# Patient Record
Sex: Male | Born: 1935 | Race: White | Hispanic: No | Marital: Married | Smoking: Former smoker
Health system: Southern US, Community
[De-identification: ages and names within clinical notes are randomized; demographics above are authoritative.]

## PROBLEM LIST (undated history)

## (undated) DIAGNOSIS — N189 Chronic kidney disease, unspecified: Secondary | ICD-10-CM

## (undated) DIAGNOSIS — D126 Benign neoplasm of colon, unspecified: Secondary | ICD-10-CM

## (undated) DIAGNOSIS — H9193 Unspecified hearing loss, bilateral: Secondary | ICD-10-CM

## (undated) DIAGNOSIS — I351 Nonrheumatic aortic (valve) insufficiency: Secondary | ICD-10-CM

## (undated) DIAGNOSIS — H3530 Unspecified macular degeneration: Secondary | ICD-10-CM

## (undated) DIAGNOSIS — R7302 Impaired glucose tolerance (oral): Secondary | ICD-10-CM

## (undated) DIAGNOSIS — K644 Residual hemorrhoidal skin tags: Secondary | ICD-10-CM

## (undated) DIAGNOSIS — C61 Malignant neoplasm of prostate: Secondary | ICD-10-CM

## (undated) DIAGNOSIS — K5790 Diverticulosis of intestine, part unspecified, without perforation or abscess without bleeding: Secondary | ICD-10-CM

## (undated) DIAGNOSIS — M5416 Radiculopathy, lumbar region: Secondary | ICD-10-CM

## (undated) DIAGNOSIS — I071 Rheumatic tricuspid insufficiency: Secondary | ICD-10-CM

## (undated) DIAGNOSIS — I4891 Unspecified atrial fibrillation: Secondary | ICD-10-CM

## (undated) DIAGNOSIS — H409 Unspecified glaucoma: Secondary | ICD-10-CM

## (undated) DIAGNOSIS — I340 Nonrheumatic mitral (valve) insufficiency: Secondary | ICD-10-CM

## (undated) DIAGNOSIS — E785 Hyperlipidemia, unspecified: Secondary | ICD-10-CM

## (undated) DIAGNOSIS — I10 Essential (primary) hypertension: Secondary | ICD-10-CM

## (undated) HISTORY — DX: Malignant neoplasm of prostate: C61

## (undated) HISTORY — DX: Unspecified hearing loss, bilateral: H91.93

## (undated) HISTORY — DX: Impaired glucose tolerance (oral): R73.02

## (undated) HISTORY — DX: Hyperlipidemia, unspecified: E78.5

## (undated) HISTORY — DX: Unspecified atrial fibrillation: I48.91

## (undated) HISTORY — DX: Radiculopathy, lumbar region: M54.16

## (undated) HISTORY — DX: Benign neoplasm of colon, unspecified: D12.6

## (undated) HISTORY — DX: Residual hemorrhoidal skin tags: K64.4

## (undated) HISTORY — DX: Rheumatic tricuspid insufficiency: I07.1

## (undated) HISTORY — DX: Essential (primary) hypertension: I10

## (undated) HISTORY — DX: Diverticulosis of intestine, part unspecified, without perforation or abscess without bleeding: K57.90

## (undated) HISTORY — DX: Chronic kidney disease, unspecified: N18.9

## (undated) HISTORY — DX: Nonrheumatic mitral (valve) insufficiency: I34.0

## (undated) HISTORY — DX: Unspecified glaucoma: H40.9

## (undated) HISTORY — DX: Nonrheumatic aortic (valve) insufficiency: I35.1

## (undated) HISTORY — PX: APPENDECTOMY: SHX54

## (undated) HISTORY — DX: Unspecified macular degeneration: H35.30

---

## 1993-01-20 HISTORY — PX: ELBOW FRACTURE SURGERY: SHX616

## 2004-01-21 HISTORY — PX: COLONOSCOPY: SHX174

## 2004-11-18 ENCOUNTER — Ambulatory Visit: Payer: Self-pay | Admitting: Internal Medicine

## 2004-11-20 DIAGNOSIS — D126 Benign neoplasm of colon, unspecified: Secondary | ICD-10-CM

## 2004-11-20 HISTORY — DX: Benign neoplasm of colon, unspecified: D12.6

## 2004-12-03 ENCOUNTER — Ambulatory Visit: Payer: Self-pay | Admitting: Internal Medicine

## 2004-12-03 ENCOUNTER — Encounter (INDEPENDENT_AMBULATORY_CARE_PROVIDER_SITE_OTHER): Payer: Self-pay | Admitting: Specialist

## 2004-12-03 DIAGNOSIS — Z860100 Personal history of colon polyps, unspecified: Secondary | ICD-10-CM | POA: Insufficient documentation

## 2005-02-04 ENCOUNTER — Ambulatory Visit: Admission: RE | Admit: 2005-02-04 | Discharge: 2005-06-18 | Payer: Self-pay | Admitting: Radiation Oncology

## 2005-04-23 ENCOUNTER — Encounter: Admission: RE | Admit: 2005-04-23 | Discharge: 2005-04-23 | Payer: Self-pay | Admitting: Urology

## 2005-04-30 ENCOUNTER — Ambulatory Visit (HOSPITAL_BASED_OUTPATIENT_CLINIC_OR_DEPARTMENT_OTHER): Admission: RE | Admit: 2005-04-30 | Discharge: 2005-04-30 | Payer: Self-pay | Admitting: Urology

## 2006-01-20 HISTORY — PX: CATARACT EXTRACTION: SUR2

## 2007-12-03 ENCOUNTER — Ambulatory Visit: Payer: Self-pay | Admitting: Cardiology

## 2007-12-03 LAB — CONVERTED CEMR LAB
Prothrombin Time: 15.7 s — ABNORMAL HIGH (ref 10.9–13.3)
TSH: 1.14 microintl units/mL (ref 0.35–5.50)

## 2007-12-09 ENCOUNTER — Ambulatory Visit: Payer: Self-pay | Admitting: Cardiology

## 2007-12-09 ENCOUNTER — Ambulatory Visit: Payer: Self-pay | Admitting: Internal Medicine

## 2007-12-09 LAB — CONVERTED CEMR LAB
Calcium: 10 mg/dL (ref 8.4–10.5)
GFR calc Af Amer: 83 mL/min
GFR calc non Af Amer: 69 mL/min
Potassium: 3.9 meq/L (ref 3.5–5.1)
Sodium: 143 meq/L (ref 135–145)

## 2007-12-15 ENCOUNTER — Ambulatory Visit: Payer: Self-pay | Admitting: Cardiology

## 2007-12-22 ENCOUNTER — Ambulatory Visit: Payer: Self-pay | Admitting: Internal Medicine

## 2007-12-23 ENCOUNTER — Encounter: Payer: Self-pay | Admitting: Cardiology

## 2007-12-23 ENCOUNTER — Ambulatory Visit: Payer: Self-pay

## 2007-12-30 ENCOUNTER — Ambulatory Visit: Payer: Self-pay | Admitting: Cardiology

## 2008-01-06 ENCOUNTER — Ambulatory Visit: Payer: Self-pay | Admitting: Cardiology

## 2008-01-21 DIAGNOSIS — H409 Unspecified glaucoma: Secondary | ICD-10-CM

## 2008-01-21 HISTORY — DX: Unspecified glaucoma: H40.9

## 2008-01-26 ENCOUNTER — Ambulatory Visit: Payer: Self-pay | Admitting: Cardiology

## 2008-02-02 ENCOUNTER — Ambulatory Visit: Payer: Self-pay | Admitting: Cardiology

## 2008-02-10 ENCOUNTER — Ambulatory Visit: Payer: Self-pay | Admitting: Cardiology

## 2008-02-15 ENCOUNTER — Ambulatory Visit: Payer: Self-pay | Admitting: Cardiology

## 2008-02-15 ENCOUNTER — Ambulatory Visit (HOSPITAL_COMMUNITY): Admission: RE | Admit: 2008-02-15 | Discharge: 2008-02-15 | Payer: Self-pay | Admitting: Cardiology

## 2008-02-18 ENCOUNTER — Ambulatory Visit: Payer: Self-pay | Admitting: Cardiovascular Disease

## 2008-03-01 ENCOUNTER — Ambulatory Visit: Payer: Self-pay | Admitting: Cardiology

## 2008-03-03 ENCOUNTER — Ambulatory Visit: Payer: Self-pay | Admitting: Cardiology

## 2008-03-17 ENCOUNTER — Ambulatory Visit: Payer: Self-pay | Admitting: Internal Medicine

## 2008-04-14 ENCOUNTER — Ambulatory Visit: Payer: Self-pay | Admitting: Cardiology

## 2008-05-12 ENCOUNTER — Ambulatory Visit: Payer: Self-pay | Admitting: Cardiology

## 2008-06-09 ENCOUNTER — Ambulatory Visit: Payer: Self-pay | Admitting: Internal Medicine

## 2008-06-20 ENCOUNTER — Encounter: Payer: Self-pay | Admitting: *Deleted

## 2008-07-05 ENCOUNTER — Encounter (INDEPENDENT_AMBULATORY_CARE_PROVIDER_SITE_OTHER): Payer: Self-pay | Admitting: *Deleted

## 2008-07-07 ENCOUNTER — Encounter (INDEPENDENT_AMBULATORY_CARE_PROVIDER_SITE_OTHER): Payer: Self-pay | Admitting: Cardiology

## 2008-07-07 ENCOUNTER — Ambulatory Visit: Payer: Self-pay | Admitting: Cardiology

## 2008-07-07 DIAGNOSIS — I4891 Unspecified atrial fibrillation: Secondary | ICD-10-CM | POA: Insufficient documentation

## 2008-07-07 LAB — CONVERTED CEMR LAB: Protime: 18.2

## 2008-07-26 ENCOUNTER — Encounter: Admission: RE | Admit: 2008-07-26 | Discharge: 2008-07-26 | Payer: Self-pay | Admitting: Emergency Medicine

## 2008-07-26 ENCOUNTER — Encounter: Payer: Self-pay | Admitting: *Deleted

## 2008-07-28 ENCOUNTER — Encounter: Admission: RE | Admit: 2008-07-28 | Discharge: 2008-07-28 | Payer: Self-pay | Admitting: Emergency Medicine

## 2008-08-04 ENCOUNTER — Ambulatory Visit: Payer: Self-pay | Admitting: Cardiology

## 2008-08-04 LAB — CONVERTED CEMR LAB
POC INR: 2.7
Prothrombin Time: 19.9 s

## 2008-08-21 DIAGNOSIS — I079 Rheumatic tricuspid valve disease, unspecified: Secondary | ICD-10-CM | POA: Insufficient documentation

## 2008-08-21 DIAGNOSIS — E785 Hyperlipidemia, unspecified: Secondary | ICD-10-CM | POA: Insufficient documentation

## 2008-08-21 DIAGNOSIS — C61 Malignant neoplasm of prostate: Secondary | ICD-10-CM | POA: Insufficient documentation

## 2008-08-21 DIAGNOSIS — I080 Rheumatic disorders of both mitral and aortic valves: Secondary | ICD-10-CM | POA: Insufficient documentation

## 2008-08-21 DIAGNOSIS — I10 Essential (primary) hypertension: Secondary | ICD-10-CM | POA: Insufficient documentation

## 2008-08-21 DIAGNOSIS — I359 Nonrheumatic aortic valve disorder, unspecified: Secondary | ICD-10-CM | POA: Insufficient documentation

## 2008-08-22 ENCOUNTER — Ambulatory Visit: Payer: Self-pay | Admitting: Cardiology

## 2008-09-01 ENCOUNTER — Ambulatory Visit: Payer: Self-pay | Admitting: Cardiology

## 2008-09-29 ENCOUNTER — Ambulatory Visit: Payer: Self-pay | Admitting: Internal Medicine

## 2008-10-27 ENCOUNTER — Ambulatory Visit: Payer: Self-pay | Admitting: Internal Medicine

## 2008-11-24 ENCOUNTER — Ambulatory Visit: Payer: Self-pay | Admitting: Cardiology

## 2008-12-22 ENCOUNTER — Ambulatory Visit: Payer: Self-pay | Admitting: Cardiology

## 2009-01-26 ENCOUNTER — Ambulatory Visit: Payer: Self-pay | Admitting: Cardiovascular Disease

## 2009-02-23 ENCOUNTER — Ambulatory Visit: Payer: Self-pay | Admitting: Cardiovascular Disease

## 2009-02-23 ENCOUNTER — Encounter (INDEPENDENT_AMBULATORY_CARE_PROVIDER_SITE_OTHER): Payer: Self-pay | Admitting: Cardiology

## 2009-03-23 ENCOUNTER — Ambulatory Visit: Payer: Self-pay | Admitting: Cardiovascular Disease

## 2009-03-23 LAB — CONVERTED CEMR LAB: POC INR: 2.5

## 2009-04-20 ENCOUNTER — Ambulatory Visit: Payer: Self-pay | Admitting: Cardiovascular Disease

## 2009-04-20 LAB — CONVERTED CEMR LAB: POC INR: 2.7

## 2009-05-18 ENCOUNTER — Ambulatory Visit: Payer: Self-pay | Admitting: Internal Medicine

## 2009-05-24 ENCOUNTER — Ambulatory Visit: Payer: Self-pay | Admitting: Cardiology

## 2009-06-01 ENCOUNTER — Ambulatory Visit: Payer: Self-pay | Admitting: Cardiology

## 2009-06-01 ENCOUNTER — Encounter: Payer: Self-pay | Admitting: Cardiology

## 2009-06-01 ENCOUNTER — Ambulatory Visit (HOSPITAL_COMMUNITY): Admission: RE | Admit: 2009-06-01 | Discharge: 2009-06-01 | Payer: Self-pay | Admitting: Cardiology

## 2009-06-01 ENCOUNTER — Ambulatory Visit: Payer: Self-pay | Admitting: Cardiovascular Disease

## 2009-06-01 ENCOUNTER — Ambulatory Visit: Payer: Self-pay

## 2009-06-04 LAB — CONVERTED CEMR LAB
CO2: 33 meq/L — ABNORMAL HIGH (ref 19–32)
Calcium: 10 mg/dL (ref 8.4–10.5)
Chloride: 102 meq/L (ref 96–112)
Creatinine, Ser: 1.3 mg/dL (ref 0.4–1.5)
Glucose, Bld: 89 mg/dL (ref 70–99)
Sodium: 143 meq/L (ref 135–145)

## 2009-06-15 ENCOUNTER — Ambulatory Visit: Payer: Self-pay | Admitting: Cardiovascular Disease

## 2009-06-15 LAB — CONVERTED CEMR LAB: POC INR: 2.2

## 2009-07-13 ENCOUNTER — Ambulatory Visit: Payer: Self-pay | Admitting: Cardiology

## 2009-07-13 LAB — CONVERTED CEMR LAB: POC INR: 2.5

## 2009-07-27 ENCOUNTER — Encounter: Admission: RE | Admit: 2009-07-27 | Discharge: 2009-07-27 | Payer: Self-pay | Admitting: Emergency Medicine

## 2009-08-10 ENCOUNTER — Ambulatory Visit: Payer: Self-pay | Admitting: Cardiology

## 2009-08-10 LAB — CONVERTED CEMR LAB: POC INR: 2.8

## 2009-09-07 ENCOUNTER — Ambulatory Visit: Payer: Self-pay | Admitting: Cardiology

## 2009-09-07 LAB — CONVERTED CEMR LAB: POC INR: 2.9

## 2009-10-05 ENCOUNTER — Ambulatory Visit: Payer: Self-pay | Admitting: Internal Medicine

## 2009-10-05 LAB — CONVERTED CEMR LAB: POC INR: 2.5

## 2009-11-02 ENCOUNTER — Ambulatory Visit: Payer: Self-pay | Admitting: Cardiology

## 2009-11-02 LAB — CONVERTED CEMR LAB: POC INR: 3

## 2009-11-30 ENCOUNTER — Ambulatory Visit: Payer: Self-pay | Admitting: Cardiology

## 2009-11-30 LAB — CONVERTED CEMR LAB: POC INR: 2.8

## 2009-12-18 ENCOUNTER — Encounter (INDEPENDENT_AMBULATORY_CARE_PROVIDER_SITE_OTHER): Payer: Self-pay | Admitting: *Deleted

## 2009-12-28 ENCOUNTER — Ambulatory Visit: Payer: Self-pay | Admitting: Internal Medicine

## 2009-12-28 LAB — CONVERTED CEMR LAB: POC INR: 2.4

## 2010-01-20 DIAGNOSIS — H9193 Unspecified hearing loss, bilateral: Secondary | ICD-10-CM

## 2010-01-20 DIAGNOSIS — H3530 Unspecified macular degeneration: Secondary | ICD-10-CM

## 2010-01-20 HISTORY — DX: Unspecified hearing loss, bilateral: H91.93

## 2010-01-20 HISTORY — DX: Unspecified macular degeneration: H35.30

## 2010-01-25 ENCOUNTER — Ambulatory Visit: Admission: RE | Admit: 2010-01-25 | Discharge: 2010-01-25 | Payer: Self-pay | Source: Home / Self Care

## 2010-01-25 LAB — CONVERTED CEMR LAB: POC INR: 2.4

## 2010-02-19 NOTE — Medication Information (Signed)
Summary: rov/ewj  Anticoagulant Therapy  Managed by: Freddrick March, RN, BSN Referring MD: Kirk Ruths MD Supervising MD: Johnsie Cancel MD, Collier Salina Indication 1: Atrial Fibrillation (ICD-427.31) Lab Used: [place] Site: [facility] INR POC 2.7 INR RANGE 2 - 3  Dietary changes: no    Health status changes: no    Bleeding/hemorrhagic complications: no    Recent/future hospitalizations: no    Any changes in medication regimen? no    Recent/future dental: no  Any missed doses?: no       Is patient compliant with meds? yes       Allergies (verified): No Known Drug Allergies  Anticoagulation Management History:      The patient is taking warfarin and comes in today for a routine follow up visit.  Positive risk factors for bleeding include an age of 75 years or older.  The bleeding index is 'intermediate risk'.  Positive CHADS2 values include History of HTN and Age > 75 years old.  The start date was 11/19/2007.  His last INR was 1.4 RATIO.  Anticoagulation responsible provider: Johnsie Cancel MD, Collier Salina.  INR POC: 2.7.  Cuvette Lot#: [id number].  Exp: 05/2010.    Anticoagulation Management Assessment/Plan:      The patient's current anticoagulation dose is Warfarin sodium 4 mg tabs: Use as directed by Anticoagulation Clinic.  The target INR is 2 - 3.  The next INR is due 05/18/2009.  Anticoagulation instructions were given to patient.  Results were reviewed/authorized by Freddrick March, RN, BSN.  He was notified by Freddrick March RN.         Prior Anticoagulation Instructions: INR 2.5  Continue on same dosage 2 tablets daily except 1.5 tablets on Tuesdays and Saturdays.  Recheck in 4 weeks.    Current Anticoagulation Instructions: INR 2.7  Continue on same dosage 2 tablets daily except 1.5 tablets on Tuesdays and Saturdays.  Recheck in 4 weeks.

## 2010-02-19 NOTE — Medication Information (Signed)
Summary: rov/sl   Anticoagulant Therapy  Managed by: Porfirio Oar, PharmD Referring MD: Kirk Ruths MD Supervising MD: Harrington Challenger MD, Nevin Bloodgood Indication 1: Atrial Fibrillation (ICD-427.31) Lab Used: [facility] Site: [facility] INR POC 2.4 INR RANGE 2 - 3  Dietary changes: no    Health status changes: no    Bleeding/hemorrhagic complications: no    Recent/future hospitalizations: no    Any changes in medication regimen? no    Recent/future dental: no  Any missed doses?: no       Is patient compliant with meds? yes       Allergies: No Known Drug Allergies  Anticoagulation Management History:      The patient is taking warfarin and comes in today for a routine follow up visit.  Positive risk factors for bleeding include an age of 75 years or older.  The bleeding index is 'intermediate risk'.  Positive CHADS2 values include History of HTN and Age > 30 years old.  The start date was 11/19/2007.  His last INR was 1.4 RATIO.  Anticoagulation responsible Leianna Barga: Harrington Challenger MD, Nevin Bloodgood.  INR POC: 2.4.  Cuvette Lot#: [id number].  Exp: 10/2010.    Anticoagulation Management Assessment/Plan:      The patient's current anticoagulation dose is Warfarin sodium 4 mg tabs: Use as directed by Anticoagulation Clinic.  The target INR is 2 - 3.  The next INR is due 01/25/2010.  Anticoagulation instructions were given to patient.  Results were reviewed/authorized by Porfirio Oar, PharmD.  He was notified by Porfirio Oar PharmD.         Prior Anticoagulation Instructions: INR 2.8  Continue taking Coumadin 2 tabs (8 mg) on all days except Coumadin 1.5 tabs (6 mg) on Tuesdays and Saturdays. Return to clinic in 4 weeks.   Current Anticoagulation Instructions: INR 2.4  Continue same dose of 2 tablets every day except 1 1/2 tablets on Tuesday and Saturday.  Recheck INR in 4 weeks.

## 2010-02-19 NOTE — Medication Information (Signed)
Summary: rov/sl   Anticoagulant Therapy  Managed by: Gypsy Lore, PharmD Referring MD: Kirk Ruths MD Supervising MD: Ron Parker MD, Dellis Filbert Indication 1: Atrial Fibrillation (ICD-427.31) Lab Used: [place] Site: [facility] INR POC 2.8 INR RANGE 2 - 3  Dietary changes: no    Health status changes: no    Bleeding/hemorrhagic complications: no    Recent/future hospitalizations: no    Any changes in medication regimen? no    Recent/future dental: no  Any missed doses?: no       Is patient compliant with meds? yes       Allergies: No Known Drug Allergies  Anticoagulation Management History:      The patient is taking warfarin and comes in today for a routine follow up visit.  Positive risk factors for bleeding include an age of 75 years or older.  The bleeding index is 'intermediate risk'.  Positive CHADS2 values include History of HTN and Age > 75 years old.  The start date was 11/19/2007.  His last INR was 1.4 RATIO.  Anticoagulation responsible Jenin Birdsall: Ron Parker MD, Dellis Filbert.  INR POC: 2.8.  Cuvette Lot#: [id number].  Exp: 10/2010.    Anticoagulation Management Assessment/Plan:      The patient's current anticoagulation dose is Warfarin sodium 4 mg tabs: Use as directed by Anticoagulation Clinic.  The target INR is 2 - 3.  The next INR is due 12/28/2009.  Anticoagulation instructions were given to patient.  Results were reviewed/authorized by Gypsy Lore, PharmD.  He was notified by Gypsy Lore PharmD.         Prior Anticoagulation Instructions: INR 3.0  Continue taking Coumadin 8 mg (2 tabs) on Sun, Mon, Wed, Thur, Fri and Coumadin 6 mg (1.5 tabs) on Tues and Sat. Return to clinc in 4 weeks.   Current Anticoagulation Instructions: INR 2.8  Continue taking Coumadin 2 tabs (8 mg) on all days except Coumadin 1.5 tabs (6 mg) on Tuesdays and Saturdays. Return to clinic in 4 weeks.

## 2010-02-19 NOTE — Medication Information (Signed)
Summary: rov/cb  Anticoagulant Therapy  Managed by: Tula Nakayama, RN, BSN Referring MD: Kirk Ruths MD Supervising MD: Stanford Breed MD, Aaron Edelman Indication 1: Atrial Fibrillation (ICD-427.31) Lab Used: [facility] Site: [facility] INR POC 2.8 INR RANGE 2 - 3  Dietary changes: no    Health status changes: no    Bleeding/hemorrhagic complications: no    Recent/future hospitalizations: no    Any changes in medication regimen? no    Recent/future dental: no  Any missed doses?: no       Is patient compliant with meds? yes       Allergies: No Known Drug Allergies  Anticoagulation Management History:      The patient is taking warfarin and comes in today for a routine follow up visit.  Positive risk factors for bleeding include an age of 75 years or older.  The bleeding index is 'intermediate risk'.  Positive CHADS2 values include History of HTN and Age > 44 years old.  The start date was 11/19/2007.  His last INR was 1.4 RATIO.  Anticoagulation responsible provider: Stanford Breed MD, Aaron Edelman.  INR POC: 2.8.  Cuvette Lot#: [id number].  Exp: 10/2010.    Anticoagulation Management Assessment/Plan:      The patient's current anticoagulation dose is Warfarin sodium 4 mg tabs: Use as directed by Anticoagulation Clinic.  The target INR is 2 - 3.  The next INR is due 09/07/2009.  Anticoagulation instructions were given to patient.  Results were reviewed/authorized by Tula Nakayama, RN, BSN.  He was notified by Tula Nakayama, RN, BSN.         Prior Anticoagulation Instructions: INR 2.5. Take 2 tablets daily except 1.5 tablets on Tues and Sat. Recheck in 4 weeks.  Current Anticoagulation Instructions: INR 2.8 Continue 8mg s everyday except 6mg s on Tuesdays and Saturdays. REcheck in 4 weeks.

## 2010-02-19 NOTE — Medication Information (Signed)
Summary: rov/ewj  Anticoagulant Therapy  Managed by: Belenda Cruise, PharmD Candidate Referring MD: Kirk Ruths MD Supervising MD: Johnsie Cancel MD, Collier Salina Indication 1: Atrial Fibrillation (ICD-427.31) Lab Used: [facility] Site: [facility] INR POC 2.2 INR RANGE 2 - 3  Dietary changes: no    Health status changes: no    Bleeding/hemorrhagic complications: no    Recent/future hospitalizations: no    Any changes in medication regimen? yes       Details: Started cefdinir 300mg  for bronchitis on 1/3.  Recent/future dental: no  Any missed doses?: no       Is patient compliant with meds? yes       Allergies (verified): No Known Drug Allergies  Anticoagulation Management History:      The patient is taking warfarin and comes in today for a routine follow up visit.  Positive risk factors for bleeding include an age of 75 years or older.  The bleeding index is 'intermediate risk'.  Positive CHADS2 values include History of HTN and Age > 37 years old.  The start date was 11/19/2007.  His last INR was 1.4 RATIO.  Anticoagulation responsible provider: Johnsie Cancel MD, Collier Salina.  INR POC: 2.2.  Cuvette Lot#: [id number].  Exp: 02/2010.    Anticoagulation Management Assessment/Plan:      The patient's current anticoagulation dose is Warfarin sodium 4 mg tabs: Use as directed by Anticoagulation Clinic.  The target INR is 2 - 3.  The next INR is due 02/23/2009.  Anticoagulation instructions were given to patient.  Results were reviewed/authorized by Belenda Cruise, PharmD Candidate.  He was notified by Belenda Cruise, PharmD Candidate.         Prior Anticoagulation Instructions: INR 2.6  Continue on same dosage 2 tablets daily except 1.5 tablets on Tuesdays and Saturdays.  Recheck in 4 weeks.    Current Anticoagulation Instructions: INR 2.2  Continue on same dose of 2 tablets daily except 1.5 tablets on Tuesdays and Saturdays. Recheck in 4 weeks.

## 2010-02-19 NOTE — Medication Information (Signed)
Summary: rov.mp  Anticoagulant Therapy  Managed by: Freddrick March, RN, BSN Referring MD: Kirk Ruths MD Supervising MD: Johnsie Cancel MD, Collier Salina Indication 1: Atrial Fibrillation (ICD-427.31) Lab Used: [place] Site: [facility] INR POC 2.5 INR RANGE 2 - 3  Dietary changes: no    Health status changes: no    Bleeding/hemorrhagic complications: no    Recent/future hospitalizations: no    Any changes in medication regimen? no    Recent/future dental: no  Any missed doses?: no       Is patient compliant with meds? yes       Allergies (verified): No Known Drug Allergies  Anticoagulation Management History:      The patient is taking warfarin and comes in today for a routine follow up visit.  Positive risk factors for bleeding include an age of 6 years or older.  The bleeding index is 'intermediate risk'.  Positive CHADS2 values include History of HTN and Age > 80 years old.  The start date was 11/19/2007.  His last INR was 1.4 RATIO.  Anticoagulation responsible provider: Johnsie Cancel MD, Collier Salina.  INR POC: 2.5.  Cuvette Lot#: [id number].  Exp: 05/2010.    Anticoagulation Management Assessment/Plan:      The patient's current anticoagulation dose is Warfarin sodium 4 mg tabs: Use as directed by Anticoagulation Clinic.  The target INR is 2 - 3.  The next INR is due 04/20/2009.  Anticoagulation instructions were given to patient.  Results were reviewed/authorized by Freddrick March, RN, BSN.  He was notified by Freddrick March RN.         Prior Anticoagulation Instructions: INR 2.2  Continue 2 tabs daily except 1.5 tabs each Tuesday and Saturday.  Recheck in 4 weeks.    Current Anticoagulation Instructions: INR 2.5  Continue on same dosage 2 tablets daily except 1.5 tablets on Tuesdays and Saturdays.  Recheck in 4 weeks.

## 2010-02-19 NOTE — Assessment & Plan Note (Signed)
Summary: F9M/DM  Medications Added QUINAPRIL HCL 20 MG TABS (QUINAPRIL HCL) one and one half tablet by mouth once daily        CC:  no complaints.  History of Present Illness: [name] is a pleasant gentleman who has a history of hypertension, hyperlipidemia, and atrial fibrillation.  Note, he did have a Myoview performed on December 23, 2007  that showed an ejection fraction 64% with no ischemia or infarction.  An echocardiogram was also performed which showed normal LV function, mild aortic insufficiency, mild-to-moderate mitral regurgitation, and mild-to-moderate tricuspid regurgitation. There was also biatrial enlargement.  A TSH was normal.  He has had a previous attempt at outpatient cardioversion.  He transiently converted to sinus rhythm, but maintained sinus rhythm only for seconds. We have therefore elected to treat him with rate control and anticoagulation. A previous Holter monitor in February of 2010 showed that his rate was adequately controlled. I last saw him in August of 2010. Since then he is doing well with no dyspnea on exertion, orthopnea, PND, pedal edema, palpitations, syncope or chest pain.  Current Medications (verified): 1)  Aspirin 81 Mg Tbec (Aspirin) .Ronnie Sullivan.. 1 Daily 2)  Doxazosin Mesylate 8 Mg Tabs (Doxazosin Mesylate) .Ronnie Sullivan.. 1 By Mouth Daily 3)  Hydrochlorothiazide 50 Mg Tabs (Hydrochlorothiazide) .... Take One Tablet By Mouth Daily. 4)  Lipitor 10 Mg Tabs (Atorvastatin Calcium) .Ronnie Sullivan.. 1 By Mouth Daily 5)  Quinapril Hcl 20 Mg Tabs (Quinapril Hcl) .Ronnie Sullivan.. 1 By Mouth Daily 6)  Metoprolol Succinate 100 Mg Xr24h-Tab (Metoprolol Succinate) .... Take One and One-Half Tablets By Mouth Daily 7)  Digoxin 0.125 Mg Tabs (Digoxin) .... Take 1 Tab By Mouth Daily 8)  Warfarin Sodium 4 Mg Tabs (Warfarin Sodium) .... Use As Directed By Anticoagulation Clinic 9)  Xalatan 0.005 % Soln (Latanoprost) .Ronnie Sullivan.. 1 Drop in Each Eye Daily 10)  Multivitamins  Tabs (Multiple Vitamin) .Ronnie Sullivan.. 1 By Mouth  Daily 11)  Fish Oil Maximum Strength 1200 Mg Caps (Omega-3 Fatty Acids) .Ronnie Sullivan.. 1 By Mouth Tid 12)  Vitamin C 500 Mg Tabs (Ascorbic Acid) .Ronnie Sullivan.. 1 Tab By Mouth Once Daily  Allergies: No Known Drug Allergies  Past History:  Past Medical History: Reviewed history from 08/21/2008 and no changes required. Current Problems:  ATRIAL FIBRILLATION (ICD-427.31) TRICUSPID REGURGITATION (ICD-397.0) MITRAL REGURGITATION (ICD-396.3) AORTIC INSUFFICIENCY (ICD-424.1) HYPERLIPIDEMIA (ICD-272.4) HYPERTENSION (ICD-401.9) PROSTATE CANCER (ICD-185) COUMADIN THERAPY (ICD-V58.61)  Past Surgical History: Reviewed history from 08/21/2008 and no changes required. Appendectomy elbow  Social History: Reviewed history from 08/21/2008 and no changes required. Retired  Married  Tobacco Use - No.  Drug Use - no  Review of Systems       Some arthralgias but no fevers or chills, productive cough, hemoptysis, dysphasia, odynophagia, melena, hematochezia, dysuria, hematuria, rash, seizure activity, orthopnea, PND, pedal edema, claudication. Remaining systems are negative.   Vital Signs:  Patient profile:   77 year old male Height:      69 inches Weight:      228 pounds BMI:     33.79 Pulse rate:   98 / minute Resp:     12 per minute BP sitting:   152 / 84  (right arm)  Vitals Entered By: Burnett Kanaris (May 24, 2009 8:44 AM)  Physical Exam  General:  Well-developed well-nourished in no acute distress.  Skin is warm and dry.  HEENT is normal.  Neck is supple. No thyromegaly.  Chest is clear to auscultation with normal expansion.  Cardiovascular exam is irregular  Abdominal exam nontender or distended. No masses palpated. Extremities show no edema. neuro grossly intact    EKG  Procedure date:  05/24/2009  Findings:      atrial fibrillation at a rate of 77. Axis normal. Nonspecific ST changes.  Impression & Recommendations:  Problem # 1:  ATRIAL FIBRILLATION (ICD-427.31) Rate  controlled. Continue beta blocker, digoxin and Coumadin. His updated medication list for this problem includes:    Aspirin 81 Mg Tbec (Aspirin) .Ronnie Sullivan... 1 daily    Metoprolol Succinate 100 Mg Xr24h-tab (Metoprolol succinate) .Ronnie Sullivan... Take one and one-half tablets by mouth daily    Digoxin 0.125 Mg Tabs (Digoxin) .Ronnie Sullivan... Take 1 tab by mouth daily    Warfarin Sodium 4 Mg Tabs (Warfarin sodium) ..... Use as directed by anticoagulation clinic  His updated medication list for this problem includes:    Aspirin 81 Mg Tbec (Aspirin) .Ronnie Sullivan... 1 daily    Metoprolol Succinate 100 Mg Xr24h-tab (Metoprolol succinate) .Ronnie Sullivan... Take one and one-half tablets by mouth daily    Digoxin 0.125 Mg Tabs (Digoxin) .Ronnie Sullivan... Take 1 tab by mouth daily    Warfarin Sodium 4 Mg Tabs (Warfarin sodium) ..... Use as directed by anticoagulation clinic  Problem # 2:  MITRAL REGURGITATION (ICD-396.3) Plan repeat echocardiogram.  Problem # 3:  HYPERLIPIDEMIA (ICD-272.4)  Continue statin. Lipids and liver monitored by primary care. His updated medication list for this problem includes:    Lipitor 10 Mg Tabs (Atorvastatin calcium) .Ronnie Sullivan... 1 by mouth daily  His updated medication list for this problem includes:    Lipitor 10 Mg Tabs (Atorvastatin calcium) .Ronnie Sullivan... 1 by mouth daily  Problem # 4:  HYPERTENSION (ICD-401.9)  Blood pressure elevated. Increase quinapril to 30 mg p.o. daily. Bmet in one week. His updated medication list for this problem includes:    Aspirin 81 Mg Tbec (Aspirin) .Ronnie Sullivan... 1 daily    Doxazosin Mesylate 8 Mg Tabs (Doxazosin mesylate) .Ronnie Sullivan... 1 by mouth daily    Hydrochlorothiazide 50 Mg Tabs (Hydrochlorothiazide) .Ronnie Sullivan... Take one tablet by mouth daily.    Quinapril Hcl 20 Mg Tabs (Quinapril hcl) ..... One and one half tablet by mouth once daily    Metoprolol Succinate 100 Mg Xr24h-tab (Metoprolol succinate) .Ronnie Sullivan... Take one and one-half tablets by mouth daily  His updated medication list for this problem includes:    Aspirin  81 Mg Tbec (Aspirin) .Ronnie Sullivan... 1 daily    Doxazosin Mesylate 8 Mg Tabs (Doxazosin mesylate) .Ronnie Sullivan... 1 by mouth daily    Hydrochlorothiazide 50 Mg Tabs (Hydrochlorothiazide) .Ronnie Sullivan... Take one tablet by mouth daily.    Quinapril Hcl 20 Mg Tabs (Quinapril hcl) ..... One and one half tablet by mouth once daily    Metoprolol Succinate 100 Mg Xr24h-tab (Metoprolol succinate) .Ronnie Sullivan... Take one and one-half tablets by mouth daily  Problem # 5:  COUMADIN THERAPY (ICD-V58.61) Monitored in the Coumadin clinic. Hemoglobin monitored by his primary care.  Other Orders: Echocardiogram (Echo)  Patient Instructions: 1)  Your physician recommends that you schedule a follow-up appointment in: ONE YEAR 2)  Your physician has requested that you have an echocardiogram.  Echocardiography is a painless test that uses sound waves to create images of your heart. It provides your doctor with information about the size and shape of your heart and how well your heart's chambers and valves are working.  This procedure takes approximately one hour. There are no restrictions for this procedure. 3)  Your physician recommends that you return for lab work in:ONE WEEK-BMP/401.1/V58.69 4)  Your  physician has recommended you make the following change in your medication: INCREASE QUINAPRIL 20MG  ONE AND ONE HALF TABLET ONCE DAILY Prescriptions: QUINAPRIL HCL 20 MG TABS (QUINAPRIL HCL) one and one half tablet by mouth once daily  #45 x 12   Entered by:   Fredia Beets, RN   Authorized by:   Colin Mulders, MD, [facility]   Signed by:   Fredia Beets, RN on 05/24/2009   Method used:   Electronically to        [id number] (retail)       [address]       [place], [place]  [address]       Ph: [phone] or [phone]       Fax: [phone]   RxID:   [id number]

## 2010-02-19 NOTE — Medication Information (Signed)
Summary: rov/gtm  Anticoagulant Therapy  Managed by: Freddrick March, RN, BSN Referring MD: Kirk Ruths MD Supervising MD: Aundra Dubin MD, [place] Indication 1: Atrial Fibrillation (ICD-427.31) Lab Used: [facility] Site: [facility] INR POC 2.9 INR RANGE 2 - 3  Dietary changes: no    Health status changes: no    Bleeding/hemorrhagic complications: no    Recent/future hospitalizations: no    Any changes in medication regimen? no    Recent/future dental: no  Any missed doses?: no       Is patient compliant with meds? yes       Allergies: No Known Drug Allergies  Anticoagulation Management History:      The patient is taking warfarin and comes in today for a routine follow up visit.  Positive risk factors for bleeding include an age of 75 years or older.  The bleeding index is 'intermediate risk'.  Positive CHADS2 values include History of HTN and Age > 68 years old.  The start date was 11/19/2007.  His last INR was 1.4 RATIO.  Anticoagulation responsible provider: Aundra Dubin MD, [place].  INR POC: 2.9.  Cuvette Lot#: [id number].  Exp: 10/2010.    Anticoagulation Management Assessment/Plan:      The patient's current anticoagulation dose is Warfarin sodium 4 mg tabs: Use as directed by Anticoagulation Clinic.  The target INR is 2 - 3.  The next INR is due 10/05/2009.  Anticoagulation instructions were given to patient.  Results were reviewed/authorized by Freddrick March, RN, BSN.  He was notified by Freddrick March RN.         Prior Anticoagulation Instructions: INR 2.8 Continue 8mg s everyday except 6mg s on Tuesdays and Saturdays. REcheck in 4 weeks.    Current Anticoagulation Instructions: INR 2.9  Continue on same dosage 2 tablets daily except 1.5 tablets on Tuesdays and Saturdays.  Recheck in 4 weeks.

## 2010-02-19 NOTE — Medication Information (Signed)
Summary: rov/ewj  Anticoagulant Therapy  Managed by: Freddrick March, RN, BSN Referring MD: Kirk Ruths MD Supervising MD: Harrington Challenger MD, Nevin Bloodgood Indication 1: Atrial Fibrillation (ICD-427.31) Lab Used: [facility] Site: [facility] INR POC 2.5 INR RANGE 2 - 3  Dietary changes: no    Health status changes: no    Bleeding/hemorrhagic complications: no    Recent/future hospitalizations: no    Any changes in medication regimen? no    Recent/future dental: no  Any missed doses?: no       Is patient compliant with meds? yes       Allergies: No Known Drug Allergies  Anticoagulation Management History:      The patient is taking warfarin and comes in today for a routine follow up visit.  Positive risk factors for bleeding include an age of 75 years or older.  The bleeding index is 'intermediate risk'.  Positive CHADS2 values include History of HTN and Age > 76 years old.  The start date was 11/19/2007.  His last INR was 1.4 RATIO.  Anticoagulation responsible Yahayra Geis: Harrington Challenger MD, Nevin Bloodgood.  INR POC: 2.5.  Cuvette Lot#: [id number].  Exp: 10/2010.    Anticoagulation Management Assessment/Plan:      The patient's current anticoagulation dose is Warfarin sodium 4 mg tabs: Use as directed by Anticoagulation Clinic.  The target INR is 2 - 3.  The next INR is due 11/02/2009.  Anticoagulation instructions were given to patient.  Results were reviewed/authorized by Freddrick March, RN, BSN.  He was notified by Freddrick March RN.         Prior Anticoagulation Instructions: INR 2.9  Continue on same dosage 2 tablets daily except 1.5 tablets on Tuesdays and Saturdays.  Recheck in 4 weeks.    Current Anticoagulation Instructions: INR 2.5  Continue on same dosage 2 tablets daily 1.5 tablets on Tuesdays and Saturdays.  Recheck in 4 weeks.

## 2010-02-19 NOTE — Medication Information (Signed)
Summary: rov/eh  Anticoagulant Therapy  Managed by: Alinda Deem, PharmD, BCPS, CPP Referring MD: Kirk Ruths MD Supervising MD: Johnsie Cancel MD, Collier Salina Indication 1: Atrial Fibrillation (ICD-427.31) Lab Used: [place] Site: [facility] INR POC 2.2 INR RANGE 2 - 3  Dietary changes: no    Health status changes: no    Bleeding/hemorrhagic complications: no    Recent/future hospitalizations: no    Any changes in medication regimen? no    Recent/future dental: no  Any missed doses?: no       Is patient compliant with meds? yes       Current Medications (verified): 1)  Aspirin 81 Mg Tbec (Aspirin) .Marland Kitchen.. 1 Daily 2)  Doxazosin Mesylate 8 Mg Tabs (Doxazosin Mesylate) .Marland Kitchen.. 1 By Mouth Daily 3)  Hydrochlorothiazide 50 Mg Tabs (Hydrochlorothiazide) .... Take One Tablet By Mouth Daily. 4)  Lipitor 10 Mg Tabs (Atorvastatin Calcium) .Marland Kitchen.. 1 By Mouth Daily 5)  Quinapril Hcl 20 Mg Tabs (Quinapril Hcl) .Marland Kitchen.. 1 By Mouth Daily 6)  Metoprolol Succinate 100 Mg Xr24h-Tab (Metoprolol Succinate) .... Take One and One-Half Tablets By Mouth Daily 7)  Digoxin 0.125 Mg Tabs (Digoxin) .... Take 1 Tab By Mouth Daily 8)  Warfarin Sodium 4 Mg Tabs (Warfarin Sodium) .... Use As Directed By Anticoagulation Clinic 9)  Xalatan 0.005 % Soln (Latanoprost) .Marland Kitchen.. 1 Drop in Each Eye Daily 10)  Multivitamins  Tabs (Multiple Vitamin) .Marland Kitchen.. 1 By Mouth Daily 11)  Fish Oil Maximum Strength 1200 Mg Caps (Omega-3 Fatty Acids) .Marland Kitchen.. 1 By Mouth Tid 12)  Vitamin C 500 Mg Tabs (Ascorbic Acid) .Marland Kitchen.. 1 Tab By Mouth Once Daily  Allergies (verified): No Known Drug Allergies  Anticoagulation Management History:      The patient is taking warfarin and comes in today for a routine follow up visit.  Positive risk factors for bleeding include an age of 75 years or older.  The bleeding index is 'intermediate risk'.  Positive CHADS2 values include History of HTN and Age > 31 years old.  The start date was 11/19/2007.  His last INR was 1.4  RATIO.  Anticoagulation responsible provider: Johnsie Cancel MD, Collier Salina.  INR POC: 2.2.  Cuvette Lot#: [id number].  Exp: 04/2010.    Anticoagulation Management Assessment/Plan:      The patient's current anticoagulation dose is Warfarin sodium 4 mg tabs: Use as directed by Anticoagulation Clinic.  The target INR is 2 - 3.  The next INR is due 03/23/2009.  Anticoagulation instructions were given to patient.  Results were reviewed/authorized by Alinda Deem, PharmD, BCPS, CPP.  He was notified by Alinda Deem PharmD, BCPS, CPP.         Prior Anticoagulation Instructions: INR 2.2  Continue on same dose of 2 tablets daily except 1.5 tablets on Tuesdays and Saturdays. Recheck in 4 weeks.  Current Anticoagulation Instructions: INR 2.2  Continue 2 tabs daily except 1.5 tabs each Tuesday and Saturday.  Recheck in 4 weeks.

## 2010-02-19 NOTE — Medication Information (Signed)
Summary: rov/ewj      Allergies Added: NKDA Anticoagulant Therapy  Managed by: Gypsy Lore, PharmD Referring MD: Kirk Ruths MD Supervising MD: Ron Parker MD, Dellis Filbert Indication 1: Atrial Fibrillation (ICD-427.31) Lab Used: [place] Site: [facility] INR POC 3.0 INR RANGE 2 - 3  Dietary changes: no    Health status changes: no    Bleeding/hemorrhagic complications: no    Recent/future hospitalizations: no    Any changes in medication regimen? no    Recent/future dental: no  Any missed doses?: no       Is patient compliant with meds? yes       Current Medications (verified): 1)  Aspirin 81 Mg Tbec (Aspirin) .Marland Kitchen.. 1 Daily 2)  Doxazosin Mesylate 8 Mg Tabs (Doxazosin Mesylate) .Marland Kitchen.. 1 By Mouth Daily 3)  Hydrochlorothiazide 50 Mg Tabs (Hydrochlorothiazide) .... Take One Tablet By Mouth Daily. 4)  Lipitor 10 Mg Tabs (Atorvastatin Calcium) .Marland Kitchen.. 1 By Mouth Daily 5)  Quinapril Hcl 20 Mg Tabs (Quinapril Hcl) .... One and One Half Tablet By Mouth Once Daily 6)  Metoprolol Succinate 100 Mg Xr24h-Tab (Metoprolol Succinate) .... Take One and One-Half Tablets By Mouth Daily 7)  Digoxin 0.125 Mg Tabs (Digoxin) .... Take 1 Tab By Mouth Daily 8)  Warfarin Sodium 4 Mg Tabs (Warfarin Sodium) .... Use As Directed By Anticoagulation Clinic 9)  Xalatan 0.005 % Soln (Latanoprost) .Marland Kitchen.. 1 Drop in Each Eye Daily 10)  Multivitamins  Tabs (Multiple Vitamin) .Marland Kitchen.. 1 By Mouth Daily 11)  Fish Oil Maximum Strength 1200 Mg Caps (Omega-3 Fatty Acids) .Marland Kitchen.. 1 By Mouth Tid 12)  Vitamin C 500 Mg Tabs (Ascorbic Acid) .Marland Kitchen.. 1 Tab By Mouth Once Daily  Allergies (verified): No Known Drug Allergies  Anticoagulation Management History:      The patient is taking warfarin and comes in today for a routine follow up visit.  Positive risk factors for bleeding include an age of 64 years or older.  The bleeding index is 'intermediate risk'.  Positive CHADS2 values include History of HTN and Age > 63 years old.  The  start date was 11/19/2007.  His last INR was 1.4 RATIO.  Anticoagulation responsible Sharde Gover: Ron Parker MD, Dellis Filbert.  INR POC: 3.0.  Cuvette Lot#: [id number].  Exp: 10/2010.    Anticoagulation Management Assessment/Plan:      The patient's current anticoagulation dose is Warfarin sodium 4 mg tabs: Use as directed by Anticoagulation Clinic.  The target INR is 2 - 3.  The next INR is due 11/30/2009.  Anticoagulation instructions were given to patient.  Results were reviewed/authorized by Gypsy Lore, PharmD.  He was notified by Gypsy Lore PharmD.         Prior Anticoagulation Instructions: INR 2.5  Continue on same dosage 2 tablets daily 1.5 tablets on Tuesdays and Saturdays.  Recheck in 4 weeks.    Current Anticoagulation Instructions: INR 3.0  Continue taking Coumadin 8 mg (2 tabs) on Sun, Mon, Wed, Thur, Fri and Coumadin 6 mg (1.5 tabs) on Tues and Sat. Return to clinc in 4 weeks.

## 2010-02-19 NOTE — Medication Information (Signed)
Summary: rov/sp  Anticoagulant Therapy  Managed by: Porfirio Oar, PharmD Referring MD: Kirk Ruths MD Supervising MD: Johnsie Cancel MD, Collier Salina Indication 1: Atrial Fibrillation (ICD-427.31) Lab Used: [facility] Site: [facility] INR POC 2.2 INR RANGE 2 - 3  Dietary changes: no    Health status changes: no    Bleeding/hemorrhagic complications: no    Recent/future hospitalizations: no    Any changes in medication regimen? no    Recent/future dental: no  Any missed doses?: no       Is patient compliant with meds? yes       Allergies: No Known Drug Allergies  Anticoagulation Management History:      The patient is taking warfarin and comes in today for a routine follow up visit.  Positive risk factors for bleeding include an age of 70 years or older.  The bleeding index is 'intermediate risk'.  Positive CHADS2 values include History of HTN and Age > 74 years old.  The start date was 11/19/2007.  His last INR was 1.4 RATIO.  Anticoagulation responsible provider: Johnsie Cancel MD, Collier Salina.  INR POC: 2.2.  Cuvette Lot#: [id number].  Exp: 08/2010.    Anticoagulation Management Assessment/Plan:      The patient's current anticoagulation dose is Warfarin sodium 4 mg tabs: Use as directed by Anticoagulation Clinic.  The target INR is 2 - 3.  The next INR is due 07/13/2009.  Anticoagulation instructions were given to patient.  Results were reviewed/authorized by Porfirio Oar, PharmD.  He was notified by Porfirio Oar PharmD.         Prior Anticoagulation Instructions: INR 2.3  Continue same dose of 2 tablets every day except 1 1/2 tablets on Tuesday and Saturday   Current Anticoagulation Instructions: INR 2.2  Continue same dose of 2 tablets every day except 1 1/2 tablets on Tuesday and Saturday.

## 2010-02-19 NOTE — Medication Information (Signed)
Summary: rov/sp  Anticoagulant Therapy  Managed by: Gwynneth Albright, PharmD Referring MD: Kirk Ruths MD Supervising MD: Aundra Dubin MD, Jesus Poplin Indication 1: Atrial Fibrillation (ICD-427.31) Lab Used: [facility] Site: [facility] INR POC 2.5 INR RANGE 2 - 3  Dietary changes: no    Health status changes: no    Bleeding/hemorrhagic complications: no    Recent/future hospitalizations: no    Any changes in medication regimen? no    Recent/future dental: no  Any missed doses?: no       Is patient compliant with meds? yes       Allergies: No Known Drug Allergies  Anticoagulation Management History:      The patient is taking warfarin and comes in today for a routine follow up visit.  Positive risk factors for bleeding include an age of 75 years or older.  The bleeding index is 'intermediate risk'.  Positive CHADS2 values include History of HTN and Age > 77 years old.  The start date was 11/19/2007.  His last INR was 1.4 RATIO.  Anticoagulation responsible provider: Aundra Dubin MD, Nakiya Rallis.  INR POC: 2.5.  Cuvette Lot#: [id number].  Exp: 08/2010.    Anticoagulation Management Assessment/Plan:      The patient's current anticoagulation dose is Warfarin sodium 4 mg tabs: Use as directed by Anticoagulation Clinic.  The target INR is 2 - 3.  The next INR is due 08/10/2009.  Anticoagulation instructions were given to patient.  Results were reviewed/authorized by Gwynneth Albright, PharmD.  He was notified by Gwynneth Albright, PharmD.         Prior Anticoagulation Instructions: INR 2.2  Continue same dose of 2 tablets every day except 1 1/2 tablets on Tuesday and Saturday.   Current Anticoagulation Instructions: INR 2.5. Take 2 tablets daily except 1.5 tablets on Tues and Sat. Recheck in 4 weeks.

## 2010-02-19 NOTE — Letter (Signed)
Summary: Colonoscopy Letter  [facility]  [place], [place] [address]   Phone: [phone]  Fax: [phone]      December 18, 2009 MRN: [id number]   [name] [address] [place], [place]  [address]   Dear Mr. [facility],   According to your medical record, it is time for you to schedule a Colonoscopy. The American Cancer Society recommends this procedure as a method to detect early colon cancer. Patients with a family history of colon cancer, or a personal history of colon polyps or inflammatory bowel disease are at increased risk.  This letter has been generated based on the recommendations made at the time of your procedure. If you feel that in your particular situation this may no longer apply, please contact our office.  Please call our office at [phone] to schedule this appointment or to update your records at your earliest convenience.  Thank you for cooperating with [place] to provide you with the very best care possible.   Sincerely,   Gatha Mayer, M.D.  [facility] [phone]

## 2010-02-19 NOTE — Medication Information (Signed)
Summary: Ronnie Sullivan  Anticoagulant Therapy  Managed by: Porfirio Oar, PharmD Referring MD: Kirk Ruths MD Supervising MD: Haroldine Laws MD, Quillian Quince Indication 1: Atrial Fibrillation (ICD-427.31) Lab Used: [facility] Site: [facility] INR POC 2.3 INR RANGE 2 - 3  Dietary changes: no    Health status changes: no    Bleeding/hemorrhagic complications: no    Recent/future hospitalizations: no    Any changes in medication regimen? no    Recent/future dental: no  Any missed doses?: no       Is patient compliant with meds? yes       Allergies: No Known Drug Allergies  Anticoagulation Management History:      The patient is taking warfarin and comes in today for a routine follow up visit.  Positive risk factors for bleeding include an age of 75 years or older.  The bleeding index is 'intermediate risk'.  Positive CHADS2 values include History of HTN and Age > 4 years old.  The start date was 11/19/2007.  His last INR was 1.4 RATIO.  Anticoagulation responsible provider: Emile Ringgenberg MD, Quillian Quince.  INR POC: 2.3.  Cuvette Lot#: [id number].  Exp: 06/2010.    Anticoagulation Management Assessment/Plan:      The patient's current anticoagulation dose is Warfarin sodium 4 mg tabs: Use as directed by Anticoagulation Clinic.  The target INR is 2 - 3.  The next INR is due 06/15/2009.  Anticoagulation instructions were given to patient.  Results were reviewed/authorized by Porfirio Oar, PharmD.  He was notified by Porfirio Oar PharmD.         Prior Anticoagulation Instructions: INR 2.7  Continue on same dosage 2 tablets daily except 1.5 tablets on Tuesdays and Saturdays.  Recheck in 4 weeks.    Current Anticoagulation Instructions: INR 2.3  Continue same dose of 2 tablets every day except 1 1/2 tablets on Tuesday and Saturday

## 2010-02-19 NOTE — Letter (Signed)
Summary: Handout Printed  Printed Handout:  - Coumadin Instructions-w/out Meds 

## 2010-02-21 NOTE — Medication Information (Signed)
Summary: rov/sp   Anticoagulant Therapy  Managed by: Porfirio Oar, PharmD Referring MD: Kirk Ruths MD Supervising MD: Stanford Breed MD, Aaron Edelman Indication 1: Atrial Fibrillation (ICD-427.31) Lab Used: [facility] Site: [facility] INR POC 2.4 INR RANGE 2 - 3  Dietary changes: no    Health status changes: no    Bleeding/hemorrhagic complications: no    Recent/future hospitalizations: no    Any changes in medication regimen? no    Recent/future dental: no  Any missed doses?: no       Is patient compliant with meds? yes       Allergies: No Known Drug Allergies  Anticoagulation Management History:      The patient is taking warfarin and comes in today for a routine follow up visit.  Positive risk factors for bleeding include an age of 75 years or older.  The bleeding index is 'intermediate risk'.  Positive CHADS2 values include History of HTN and Age > 41 years old.  The start date was 11/19/2007.  His last INR was 1.4 RATIO.  Anticoagulation responsible provider: Stanford Breed MD, Aaron Edelman.  INR POC: 2.4.  Cuvette Lot#: [id number].  Exp: 08/2010.    Anticoagulation Management Assessment/Plan:      The patient's current anticoagulation dose is Warfarin sodium 4 mg tabs: Use as directed by Anticoagulation Clinic.  The target INR is 2 - 3.  The next INR is due 02/22/2010.  Anticoagulation instructions were given to patient.  Results were reviewed/authorized by Porfirio Oar, PharmD.  He was notified by Nilda Simmer, PharmD Candidate .         Prior Anticoagulation Instructions: INR 2.4  Continue same dose of 2 tablets every day except 1 1/2 tablets on Tuesday and Saturday.  Recheck INR in 4 weeks.   Current Anticoagulation Instructions: INR 2.4  Coumadin 4 mg tablets - Take 2 tablets every day except 1.5 tablets on Tuesdays and Saturdays

## 2010-02-22 ENCOUNTER — Encounter: Payer: Self-pay | Admitting: Internal Medicine

## 2010-02-22 ENCOUNTER — Encounter (INDEPENDENT_AMBULATORY_CARE_PROVIDER_SITE_OTHER): Payer: Federal, State, Local not specified - PPO

## 2010-02-22 ENCOUNTER — Ambulatory Visit: Admit: 2010-02-22 | Payer: Self-pay

## 2010-02-22 DIAGNOSIS — I4891 Unspecified atrial fibrillation: Secondary | ICD-10-CM

## 2010-02-22 DIAGNOSIS — Z7901 Long term (current) use of anticoagulants: Secondary | ICD-10-CM

## 2010-02-27 NOTE — Medication Information (Signed)
Summary: rov/ewj  Anticoagulant Therapy  Managed by: Freddrick March, RN, BSN Referring MD: Kirk Ruths MD Supervising MD: Harrington Challenger MD, Nevin Bloodgood Indication 1: Atrial Fibrillation (ICD-427.31) Lab Used: [facility]  Site: [facility] INR POC 2.4 INR RANGE 2 - 3  Dietary changes: no    Health status changes: no    Bleeding/hemorrhagic complications: no    Recent/future hospitalizations: no    Any changes in medication regimen? no    Recent/future dental: no  Any missed doses?: no       Is patient compliant with meds? yes       Allergies: No Known Drug Allergies  Anticoagulation Management History:      The patient is taking warfarin and comes in today for a routine follow up visit.  Positive risk factors for bleeding include an age of 35 years or older.  The bleeding index is 'intermediate risk'.  Positive CHADS2 values include History of HTN and Age > 52 years old.  The start date was 11/19/2007.  His last INR was 1.4 RATIO.  Anticoagulation responsible provider: Harrington Challenger MD, Nevin Bloodgood.  INR POC: 2.4.  Cuvette Lot#: [id number].  Exp: 01/2011.    Anticoagulation Management Assessment/Plan:      The patient's current anticoagulation dose is Warfarin sodium 4 mg tabs: Use as directed by Anticoagulation Clinic.  The target INR is 2 - 3.  The next INR is due 03/22/2010.  Anticoagulation instructions were given to patient.  Results were reviewed/authorized by Freddrick March, RN, BSN.  He was notified by Freddrick March RN.         Prior Anticoagulation Instructions: INR 2.4  Coumadin 4 mg tablets - Take 2 tablets every day except 1.5 tablets on Tuesdays and Saturdays    Current Anticoagulation Instructions: INR 2.4  Continue on same dosage 2 tablets daily except 1.5 tablets on Tuesdays and Saturdays.  Recheck in 4 weeks.

## 2010-03-21 ENCOUNTER — Encounter: Payer: Self-pay | Admitting: Cardiology

## 2010-03-21 DIAGNOSIS — I4891 Unspecified atrial fibrillation: Secondary | ICD-10-CM

## 2010-03-22 ENCOUNTER — Encounter: Payer: Self-pay | Admitting: Internal Medicine

## 2010-03-22 ENCOUNTER — Encounter (INDEPENDENT_AMBULATORY_CARE_PROVIDER_SITE_OTHER): Payer: Federal, State, Local not specified - PPO

## 2010-03-22 DIAGNOSIS — I4891 Unspecified atrial fibrillation: Secondary | ICD-10-CM

## 2010-03-22 DIAGNOSIS — Z7901 Long term (current) use of anticoagulants: Secondary | ICD-10-CM

## 2010-03-28 NOTE — Medication Information (Signed)
Summary: Ronnie Sullivan  Anticoagulant Therapy  Managed by: Freddrick March, RN, BSN Referring MD: Kirk Ruths MD Supervising MD: Haroldine Laws MD, Quillian Quince Indication 1: Atrial Fibrillation (ICD-427.31) Lab Used: [facility] Site: [facility] INR POC 2.7 INR RANGE 2 - 3  Dietary changes: no    Health status changes: no    Bleeding/hemorrhagic complications: no    Recent/future hospitalizations: no    Any changes in medication regimen? no    Recent/future dental: no  Any missed doses?: no       Is patient compliant with meds? yes       Allergies: No Known Drug Allergies  Anticoagulation Management History:      The patient is taking warfarin and comes in today for a routine follow up visit.  Positive risk factors for bleeding include an age of 75 years or older.  The bleeding index is 'intermediate risk'.  Positive CHADS2 values include History of HTN and Age > 30 years old.  The start date was 11/19/2007.  His last INR was 1.4 RATIO.  Anticoagulation responsible provider: Keyna Blizard MD, Quillian Quince.  INR POC: 2.7.  Cuvette Lot#: [id number].  Exp: 01/2011.    Anticoagulation Management Assessment/Plan:      The patient's current anticoagulation dose is Warfarin sodium 4 mg tabs: Use as directed by Anticoagulation Clinic.  The target INR is 2 - 3.  The next INR is due 04/19/2010.  Anticoagulation instructions were given to patient.  Results were reviewed/authorized by Freddrick March, RN, BSN.  He was notified by Freddrick March RN.         Prior Anticoagulation Instructions: INR 2.4  Continue on same dosage 2 tablets daily except 1.5 tablets on Tuesdays and Saturdays.  Recheck in 4 weeks.    Current Anticoagulation Instructions: INR 2.7  Continue on same dosage 2 tablets daily except 1.5 tablets on Tuesdays and Saturdays.  Recheck 4 weeks.

## 2010-04-08 ENCOUNTER — Telehealth: Payer: Self-pay | Admitting: Cardiology

## 2010-04-18 NOTE — Progress Notes (Signed)
Summary: refill/ Pt out medication  Medications Added METOPROLOL SUCCINATE 100 MG XR24H-TAB (METOPROLOL SUCCINATE) Take one and one-half tablets by mouth daily       Phone Note Refill Request Message from:  Patient on April 08, 2010 8:49 AM  Refills Requested: Medication #1:  METOPROLOL SUCCINATE 100 MG XR24H-TAB Take one and one-half tablets by mouth daily [facility] [address]  Initial call taken by: Delsa Sale,  April 08, 2010 8:50 AM  Follow-up for Phone Call        RX sent into pharmacy. Pt notified. Levora Angel, CNA  April 08, 2010 9:59 AM  Follow-up by: Levora Angel, CNA,  April 08, 2010 9:59 AM    New/Updated Medications: METOPROLOL SUCCINATE 100 MG XR24H-TAB (METOPROLOL SUCCINATE) Take one and one-half tablets by mouth daily Prescriptions: METOPROLOL SUCCINATE 100 MG XR24H-TAB (METOPROLOL SUCCINATE) Take one and one-half tablets by mouth daily  #45 x 8   Entered by:   Levora Angel, CNA   Authorized by:   Colin Mulders, MD, [facility]   Signed by:   Levora Angel, CNA on 04/08/2010   Method used:   Electronically to        [id number] (retail)       [place]       [place], [place]  [address]       Ph: [phone] or [phone]       Fax: [phone]   RxID:   [id number]

## 2010-04-19 ENCOUNTER — Ambulatory Visit (INDEPENDENT_AMBULATORY_CARE_PROVIDER_SITE_OTHER): Payer: Federal, State, Local not specified - PPO | Admitting: *Deleted

## 2010-04-19 DIAGNOSIS — Z7901 Long term (current) use of anticoagulants: Secondary | ICD-10-CM | POA: Insufficient documentation

## 2010-04-19 DIAGNOSIS — I4891 Unspecified atrial fibrillation: Secondary | ICD-10-CM

## 2010-04-19 NOTE — Patient Instructions (Signed)
Continue on same dosage 2 tablets daily except 1.5 tablets on Tuesdays and Saturdays.  Recheck in 4 weeks.

## 2010-05-06 LAB — CBC
MCHC: 33.3 g/dL (ref 30.0–36.0)
MCV: 94.1 fL (ref 78.0–100.0)
WBC: 5.1 10*3/uL (ref 4.0–10.5)

## 2010-05-06 LAB — BASIC METABOLIC PANEL
CO2: 29 mEq/L (ref 19–32)
Chloride: 104 mEq/L (ref 96–112)
Creatinine, Ser: 1.29 mg/dL (ref 0.4–1.5)
GFR calc Af Amer: >60 mL/min (ref >60)
Glucose, Bld: 102 mg/dL — ABNORMAL HIGH (ref 70–99)
Potassium: 3.7 mEq/L (ref 3.5–5.1)

## 2010-05-06 LAB — PROTIME-INR
INR: 2.7 — ABNORMAL HIGH (ref 0.00–1.49)
Prothrombin Time: 30.6 seconds — ABNORMAL HIGH (ref 11.6–15.2)

## 2010-05-17 ENCOUNTER — Ambulatory Visit (INDEPENDENT_AMBULATORY_CARE_PROVIDER_SITE_OTHER): Payer: Federal, State, Local not specified - PPO | Admitting: *Deleted

## 2010-05-17 DIAGNOSIS — I4891 Unspecified atrial fibrillation: Secondary | ICD-10-CM

## 2010-05-17 LAB — POCT INR: INR: 2.9

## 2010-05-29 ENCOUNTER — Telehealth: Payer: Self-pay | Admitting: Cardiology

## 2010-05-29 MED ORDER — QUINAPRIL HCL 20 MG PO TABS: ORAL_TABLET | ORAL | Status: DC

## 2010-05-29 NOTE — Telephone Encounter (Signed)
Pt needs refill on quinapril 20 mg #45 .  Pt called the pharmacy yesterday and they were to get in touch with [place].  Checked with phar at noon and no refill has been sent.

## 2010-06-04 NOTE — Cardiovascular Report (Signed)
NAMEKALOYAN, ACREE NO.:  [id number]   MEDICAL RECORD NO.:  [id number]          PATIENT TYPE:  OIB   LOCATION:  2899                         FACILITY:  [facility]   PHYSICIAN:  Denice Bors. Stanford Breed, MD, FACCDATE OF BIRTH:  10/24/1928   DATE OF PROCEDURE:  02/15/2008  DATE OF DISCHARGE:  02/15/2008                            CARDIAC CATHETERIZATION   This is cardioversion of atrial fibrillation.  The patient was sedated  by Anesthesia with Pentothal 250 mg intravenously x1.  Synchronized  cardioversion with 150 joules (biphasic) resulted in normal sinus rhythm  with PACs.  There were no immediate complications.  We would recommend  continuing the patient's present medications including Coumadin.  He  will follow up with me in the office in 2-4 weeks.      Denice Bors Stanford Breed, MD, [facility]  Electronically Signed     BSC/MEDQ  D:  02/15/2008  T:  02/16/2008  Job:  [id number]

## 2010-06-04 NOTE — Assessment & Plan Note (Signed)
[facility]                            CARDIOLOGY OFFICE NOTE   NAME:Ronnie Sullivan, Ronnie Sullivan                   MRN:          [id number]  DATE:03/01/2008                            DOB:          1928/08/04    Ronnie Sullivan is a pleasant gentleman who has a history of hypertension,  hyperlipidemia, and atrial fibrillation.  Note, he did have a Myoview  performed in December that showed an ejection fraction 64% with no  ischemia or infarction.  An echocardiogram was also performed which  showed normal LV function, mild aortic insufficiency, mild-to-moderate  mitral regurgitation, and mild-to-moderate tricuspid regurgitation.  There was also biatrial enlargement.  A TSH was normal.  We recently  brought the patient to the hospital and proceeded with an outpatient  cardioversion.  He transiently converted to sinus rhythm, but maintained  sinus rhythm only for seconds.  Since that time, he is doing well from  symptomatic standpoint.  He denies any dyspnea, chest pain,  palpitations, or syncope.  There is no pedal edema.   MEDICATIONS:  1. Aspirin 81 mg p.o. daily.  2. Doxazosin 8 mg  p.o. daily.  3. HCTZ 50 mg p.o. daily.  4. Lipitor 10 mg p.o. daily.  5. Digoxin 125 mcg p.o. daily.  6. Coumadin as directed and follow in our Coumadin Clinic.  7. Xalatan eyedrops.  8. Multivitamin.  9. Vitamin C.  10.Omega-3.  11.Toprol 150 mg p.o. daily.  12.Quinapril 20 mg p.o. daily.   PHYSICAL EXAMINATION:  VITAL SIGNS:  Blood pressure is 122/90.  His  pulse is 93.  His weight is 233 pounds.  HEENT:  Normal.  NECK:  Supple.  CHEST:  Clear.  CARDIOVASCULAR:  Irregular rhythm.  ABDOMEN:  No tenderness.  EXTREMITIES:  No edema.   His electrocardiogram shows atrial fibrillation at a rate of 90.  The  axis is normal.  There are nonspecific ST changes.   DIAGNOSES:  1. Atrial fibrillation - [name] remains in atrial fibrillation      today.  He did  transiently convert to sinus rhythm after his      cardioversion, but this only lasted for approximately 15 seconds.      Our options would be to continue with a rate control and      anticoagulation versus initiating an antiarrhythmic and with      reattempts at cardioversion.  However, he would still need to      continue his Coumadin.  He would like to continue with rate control      and anticoagulation.  I think this is appropriate as he is having      no symptoms.  We will continue with his digoxin and Toprol and I      will schedule him to have a 24-hour Holter monitor to make sure      that his rate is adequately controlled over 24 hours.  He will also      continue on Coumadin with goal INR 2-3 as his CHADS2 score is 2 for      hypertension and age greater than 70.  I will see him back in 6      months.  2. Hypertension - his blood pressure is mildly elevated today.  We      will track this and increase his medications as indicated.  3. Coumadin therapy - this is being monitored in our Coumadin Clinic.  4. Hyperlipidemia - he will continue on his statin and this is being      followed by Dr. Jake Michaelis.  5. History prostate cancer.     Denice Bors Stanford Breed, MD, [facility]  Electronically Signed    BSC/MedQ  DD: 03/01/2008  DT: 03/01/2008  Job #: [id number]   cc:   Harden Mo, M.D.

## 2010-06-04 NOTE — Assessment & Plan Note (Signed)
[place] OFFICE NOTE   NAME:Shackleford, BREKYN VANGORDER                   MRN:          [id number]  DATE:01/26/2008                            DOB:          25-Feb-1928    Ronnie Sullivan is a very pleasant 77 year old gentleman that I have  recently saw on December 03, 2007, who has a history of hypertension and  hyperlipidemia.  When I saw him, we were asked to evaluate for new onset  atrial fibrillation.  The duration was unknown.  We scheduled him to  have a Myoview which performed on December 23, 2007.  His ejection  fraction was 64% and there was no ischemia or infarction.  He also had  an echocardiogram on December 23, 2007 that showed normal LV function,  mild aortic insufficiency, mild-to-moderate mitral regurgitation, mild-  to-moderate tricuspid regurgitation, and biatrial enlargement.  A TSH  was normal at 1.14.  The patient was placed on Coumadin.  Since then, he  has done well with no dyspnea, chest pain, palpitations, or syncope.  There is no pedal edema.  He has not had any bleeding.  He denies any  increased fatigue.  There is no dizziness.   MEDICATIONS:  1. Aspirin 81 mg p.o. daily.  2. Doxazosin 8 mg p.o. daily.  3. HCTZ 50 mg p.o. daily.  4. Lipitor 10 mg p.o. daily.  5. Digoxin 125 mcg p.o. daily.  6. Coumadin as directed.  7. Eye drops.  8. Multivitamin daily.  9. Vitamin C.  10.Omega-3 fish oil.  11.Toprol 150 mg p.o. daily.  12.Quinapril 20 mg p.o. daily.   PHYSICAL EXAMINATION:  VITAL SIGNS:  Blood pressure of 133/83 and he  weighs 235 pounds.  His pulse is 90.  HEENT:  Normal.  NECK:  Supple.  CHEST:  Clear.  CARDIOVASCULAR:  Irregular rhythm.  ABDOMEN:  No tenderness.  EXTREMITIES:  No edema.   Electrocardiogram shows atrial fibrillation at rate of 90.  There are no  significant ST changes.   DIAGNOSES:  1. Atrial fibrillation - The duration of Ronnie Sullivan atrial  fibrillation is unclear.  He will continue on his Toprol and      digoxin.  He will also continue on Coumadin with goal INR of 2-3.      His CHADS2 score is 2 for hypertension and age greater than 72.  We      will plan to proceed with a cardioversion, if his INR has been      therapeutic for 3 consecutive weeks.  We will check this today.  If      he does have recurrent atrial fibrillation in the future, then I      would favor rate control on anticoagulation, as he is relatively      asymptomatic with this.  If indeed, he does develop recurrent      atrial fibrillation in the future, then we will need to schedule      him to have a monitor to make sure his heart rate is adequately      controlled over 24 hours.  2. Hypertension - His blood pressure is adequately controlled on his      present medications.  3. Coumadin therapy - This is being monitored in our Coumadin Clinic      and our goal INR is 2-3.  4. Hyperlipidemia - He will continue on his statin.  This is being      managed by Dr. Jake Michaelis.  5. History of prostate cancer.   We will see him back in 3 months.     Denice Bors Stanford Breed, MD, [facility]  Electronically Signed    BSC/MedQ  DD: 01/26/2008  DT: 01/26/2008  Job #: [id number]   cc:   Harden Mo, M.D.

## 2010-06-04 NOTE — Assessment & Plan Note (Signed)
[facility]                            CARDIOLOGY OFFICE NOTE   NAME:Ronnie Sullivan, Ronnie Sullivan                   MRN:          [id number]  DATE:12/03/2007                            DOB:          1928-09-20    Ronnie Sullivan is a very pleasant 77 year old gentleman with past  medical history of hypertension and hyperlipidemia.  I am asked to  evaluate for atrial fibrillation.  The patient has no prior cardiac  history.  He typically does not have dyspnea on exertion, orthopnea,  PND, pedal edema, palpitations, presyncope, syncope, or exertional chest  pain.  He was seen by Dr. Jake Michaelis in October.  At that time, he was found  to have a new onset atrial fibrillation.  He was placed on Coumadin and  digoxin at that time and his Toprol was continued.  Because of the  above, we were asked to further evaluate.   CURRENT MEDICATIONS:  1. Aspirin 81 mg p.o. daily.  2. Doxazosin 8 mg p.o. daily.  3. Hydrochlorothiazide 50 mg p.o. daily.  4. Lipitor 10 mg p.o. daily.  5. Quinapril 40 mg daily.  6. Metoprolol, he takes 50 mg once a day.  7. Digoxin 125 mcg p.o. daily.  8. Coumadin as directed.  9. Eye drops.  10.Multivitamin daily.  11.Vitamin C.  12.Omega fish oil.   ALLERGIES:  He has no known drug allergies.   SOCIAL HISTORY:  He has remote history of tobacco use, but has not  smoked since 1971.  He does not consume alcohol.   FAMILY HISTORY:  Negative for coronary artery disease.  His family  history is significant for an aneurysm in his father (he states he died  of cerebral hemorrhage).  His mother had renal insufficiency.   PAST MEDICAL HISTORY:  There is no diabetes mellitus, but there is  hypertension and hyperlipidemia.  He has a history of prostate cancer  and has had previous seed implants.  He has been diagnosed with recent  atrial fibrillation.  He has had previous elbow surgery secondary to a  fracture as well as an appendectomy.  He  does have gout and glaucoma.   REVIEW OF SYSTEMS:  He denies any headaches, fevers, or chills.  There  is no productive cough or hemoptysis.  There is no dysphagia,  odynophagia, melena, or hematochezia.  There is no dysuria or hematuria.  There is no rash or seizure activity.  There is no orthopnea, PND, or  pedal edema.  There is no claudication noted.  The remaining systems are  negative.   PHYSICAL EXAMINATION:  VITAL SIGNS:  Today, his blood pressure is 130/80  and his pulse is 108.  GENERAL:  He is well-developed, well-nourished, in no acute distress.  He does not appear to be depressed.  SKIN:  Warm and dry.  BACK:  Normal.  HEENT:  Normal.  Normal eyelids.  NECK:  Supple with normal upstroke bilaterally.  No bruits heard.  There  is no jugular vein distention.  I cannot appreciate thyromegaly.  CHEST:  Clear to auscultation.  There is no expansion.  CARDIOVASCULAR:  Tachycardic rate and irregular rhythm.  There are no  murmurs, rubs, or gallops noted.  ABDOMEN:  Nontender.  Positive bowel sounds.  No hepatosplenomegaly.  No  mass appreciated.  There is no abdominal bruit.  He has 2+ femoral  pulses bilaterally.  No bruits.  EXTREMITIES:  There is no peripheral clubbing.  No edema.  I palpate no  cords.  He has 2+ dorsalis pedis pulses bilaterally.  NEUROLOGIC:  Grossly intact.   His electrocardiogram shows atrial fibrillation at a rate of 108.  The  axis is normal.  There are nonspecific ST changes.   DIAGNOSES:  1. Atrial fibrillation - the duration of this is not clear to me.  I      can also elicit no symptoms including of dyspnea, palpitations, or      fatigue.  He is on Lopressor at unclear dose.  I have asked him to      contact us when he gets home and we will adjust this is indicated.      His heart rate is elevated and we will most likely increase this      dose as well as continue with his digoxin.  If we do increase his      metoprolol, then we will most  likely decrease his quinapril to 20      mg p.o. daily.  He will also continue on Coumadin as he has a      CHADS2 score of 2 and 4, hypertension, and age greater than 48.  I      will have him follow up in the Coumadin Clinic with a goal INR 2-3.      We will also schedule him to have a TSH to exclude hyperthyroidism      as well as an echocardiogram to quantify his left ventricular      function.  Also, schedule him to have an adenosine Myoview.  Once      his INR has been therapeutic for three consecutive weeks, then we      will plan to proceed with an outpatient cardioversion to see if he      will maintain sinus rhythm.  If not, then I would favor rate      control and anticoagulation as he is relatively asymptomatic from      this.  2. Hypertension - we will be increasing his Lopressor for his atrial      fibrillation and therefore most likely decrease his quinapril to 20      mg p.o. daily.  3. Coumadin therapy - now we are referring her to Coumadin Clinic and      a goal INR will be 2-3.  4. Hyperlipidemia - he will continue on statin and this is being      monitored by Dr. Jake Michaelis.  5. History of prostate cancer.   I will see him back in approximately 8 weeks.     Denice Bors Stanford Breed, MD, [facility]  Electronically Signed    BSC/MedQ  DD: 12/03/2007  DT: 12/04/2007  Job #: [id number]   cc:   Harden Mo, M.D.

## 2010-06-07 NOTE — Op Note (Signed)
Ronnie Sullivan, Ronnie Sullivan            ACCOUNT NO.:  [id number]   MEDICAL RECORD NO.:  [id number]          PATIENT TYPE:  AMB   LOCATION:  [facility]                         FACILITY:  [facility]   PHYSICIAN:  Lillette Boxer. Dahlstedt, M.D.DATE OF BIRTH:  April 16, 1928   DATE OF PROCEDURE:  04/30/2005  DATE OF DISCHARGE:                                 OPERATIVE REPORT   PREOPERATIVE DIAGNOSIS:  Prostate cancer.   POSTOPERATIVE DIAGNOSIS:  Prostate cancer.   PROCEDURE:  I-125 brachytherapy placement.   SURGEON:  Lillette Boxer. Diona Fanti, M.D.   RADIATION ONCOLOGIST:  Rexene Edison, M.D.   COMPLICATIONS:  None.   BRIEF HISTORY:  77 year old male with biopsy proven adenocarcinoma the  prostate.  He underwent biopsy for a PSA of 4.3.  He had bilateral Gleason  3+4 with 5% of the tissue on the right and 60% of the tissue on the left  involved with cancer.  He did have nodularity on the left side.  The patient  has been counseled on treatment options.  He has chosen brachytherapy.  The  risks and complications have been discussed.  He has had external beam  radiotherapy prior to this.   DESCRIPTION OF PROCEDURE:  The patient was given preoperative IV antibiotics  and taken to the operating room where he was administered a general  anesthetic.  He was placed in the dorsal lithotomy position.  The genitalia  and perineum were prepped after a rectal tube was placed and the bladder was  catheterized with a Foley catheter which was left in.  The transrectal  ultrasound probe was placed.  The prostate was studied, and the prostate,  urethra and rectum marked as part of the planning process.  A treatment plan  was established to administer 17 needles with 50 seeds.  I-125 seeds were  used with an initial dose rate of 0.044 JY/H.  Total dose delivered  /reference dose was 90 gray.   The fixation needles were placed.  All needles were then placed and using  the Nucletron, the seeds were placed.  Please see the  sonographic  planning/oncology treatment page.   Following administration of all seeds, the Foley catheter was removed.  Cystoscopy revealed a small clot in the bladder but no seeds were  identified.  The bladder was then recatheterized with a 16 French Foley  catheter.  This  was hooked to dependent drainage.  The patient tolerated the procedure well.  He was awakened and taken to PACU in stable condition.   He will be discharged on Cipro 500 mg a day for five days and Vicodin as  well as his usual medications.      Lillette Boxer. Dahlstedt, M.D.  Electronically Signed     SMD/MEDQ  D:  04/30/2005  T:  04/30/2005  Job:  [id number]   cc:   Rexene Edison, M.D.  Fax: [phone]   Harden Mo, M.D.  Fax: [phone]

## 2010-06-14 ENCOUNTER — Ambulatory Visit (INDEPENDENT_AMBULATORY_CARE_PROVIDER_SITE_OTHER): Payer: Federal, State, Local not specified - PPO | Admitting: *Deleted

## 2010-06-14 DIAGNOSIS — I4891 Unspecified atrial fibrillation: Secondary | ICD-10-CM

## 2010-06-14 LAB — POCT INR: INR: 3

## 2010-07-12 ENCOUNTER — Encounter: Payer: Federal, State, Local not specified - PPO | Admitting: *Deleted

## 2010-07-16 ENCOUNTER — Ambulatory Visit (INDEPENDENT_AMBULATORY_CARE_PROVIDER_SITE_OTHER): Payer: Federal, State, Local not specified - PPO | Admitting: *Deleted

## 2010-07-16 DIAGNOSIS — I4891 Unspecified atrial fibrillation: Secondary | ICD-10-CM

## 2010-08-06 ENCOUNTER — Ambulatory Visit (INDEPENDENT_AMBULATORY_CARE_PROVIDER_SITE_OTHER): Payer: Federal, State, Local not specified - PPO | Admitting: *Deleted

## 2010-08-06 DIAGNOSIS — I4891 Unspecified atrial fibrillation: Secondary | ICD-10-CM

## 2010-09-03 ENCOUNTER — Ambulatory Visit (INDEPENDENT_AMBULATORY_CARE_PROVIDER_SITE_OTHER): Payer: Federal, State, Local not specified - PPO | Admitting: *Deleted

## 2010-09-03 DIAGNOSIS — I4891 Unspecified atrial fibrillation: Secondary | ICD-10-CM

## 2010-10-01 ENCOUNTER — Ambulatory Visit (INDEPENDENT_AMBULATORY_CARE_PROVIDER_SITE_OTHER): Payer: Federal, State, Local not specified - PPO | Admitting: *Deleted

## 2010-10-01 DIAGNOSIS — I4891 Unspecified atrial fibrillation: Secondary | ICD-10-CM

## 2010-10-29 ENCOUNTER — Ambulatory Visit (INDEPENDENT_AMBULATORY_CARE_PROVIDER_SITE_OTHER): Payer: Federal, State, Local not specified - PPO | Admitting: *Deleted

## 2010-10-29 DIAGNOSIS — I4891 Unspecified atrial fibrillation: Secondary | ICD-10-CM

## 2010-11-26 ENCOUNTER — Ambulatory Visit (INDEPENDENT_AMBULATORY_CARE_PROVIDER_SITE_OTHER): Payer: Federal, State, Local not specified - PPO | Admitting: *Deleted

## 2010-11-26 DIAGNOSIS — I4891 Unspecified atrial fibrillation: Secondary | ICD-10-CM

## 2010-11-26 DIAGNOSIS — Z7901 Long term (current) use of anticoagulants: Secondary | ICD-10-CM

## 2010-12-10 ENCOUNTER — Encounter: Payer: Self-pay | Admitting: Cardiology

## 2010-12-17 ENCOUNTER — Encounter: Payer: Self-pay | Admitting: Cardiology

## 2010-12-17 ENCOUNTER — Ambulatory Visit (INDEPENDENT_AMBULATORY_CARE_PROVIDER_SITE_OTHER): Payer: Federal, State, Local not specified - PPO | Admitting: *Deleted

## 2010-12-17 ENCOUNTER — Ambulatory Visit (INDEPENDENT_AMBULATORY_CARE_PROVIDER_SITE_OTHER): Payer: Federal, State, Local not specified - PPO | Admitting: Cardiology

## 2010-12-17 VITALS — BP 130/70 | HR 69 | Ht 69.0 in | Wt 236.0 lb

## 2010-12-17 DIAGNOSIS — Z7901 Long term (current) use of anticoagulants: Secondary | ICD-10-CM

## 2010-12-17 DIAGNOSIS — I080 Rheumatic disorders of both mitral and aortic valves: Secondary | ICD-10-CM

## 2010-12-17 DIAGNOSIS — I10 Essential (primary) hypertension: Secondary | ICD-10-CM

## 2010-12-17 DIAGNOSIS — E785 Hyperlipidemia, unspecified: Secondary | ICD-10-CM

## 2010-12-17 DIAGNOSIS — I4891 Unspecified atrial fibrillation: Secondary | ICD-10-CM

## 2010-12-17 LAB — POCT INR: INR: 3

## 2010-12-17 NOTE — Assessment & Plan Note (Signed)
Mild on most recent echo. 

## 2010-12-17 NOTE — Patient Instructions (Signed)
Your physician wants you to follow-up in: [place] will receive a reminder letter in the mail two months in advance. If you don't receive a letter, please call our office to schedule the follow-up appointment.   STOP ASPIRIN

## 2010-12-17 NOTE — Progress Notes (Signed)
HPI:[name] is a pleasant gentleman who has a history of hypertension, hyperlipidemia, and atrial fibrillation.  Note, he did have a Myoview performed on December 23, 2007  that showed an ejection fraction 64% with no ischemia or infarction.  Last echocardiogram was performed in May of 2011 and showed normal LV function, trace aortic insufficiency, mild mitral regurgitation, and there was also biatrial enlargement.  Previous TSH was normal.  He has had a previous attempt at outpatient cardioversion.  He transiently converted to sinus rhythm, but maintained sinus rhythm only for seconds. We have therefore elected to treat him with rate control and anticoagulation. A previous Holter monitor in February of 2010 showed that his rate was adequately controlled. I last saw him in May of 2011. Since then he is doing well with no dyspnea on exertion, orthopnea, PND, pedal edema, palpitations, syncope or chest pain.  Current Outpatient Prescriptions  Medication Sig Dispense Refill  . aspirin 81 MG tablet Take 81 mg by mouth daily.        Marland Kitchen atorvastatin (LIPITOR) 10 MG tablet Take 10 mg by mouth daily.        . Bromfenac Sodium (BROMDAY) 0.09 % SOLN Apply to eye as needed.        . colchicine 0.6 MG tablet Take 0.6 mg by mouth daily.        . digoxin (LANOXIN) 0.125 MG tablet Take 125 mcg by mouth daily.        Marland Kitchen doxazosin (CARDURA) 8 MG tablet Take 8 mg by mouth at bedtime.        . fish oil-omega-3 fatty acids 1000 MG capsule Take 1,200 mg by mouth daily.        . hydrochlorothiazide (HYDRODIURIL) 50 MG tablet Take 50 mg by mouth daily.        Marland Kitchen latanoprost (XALATAN) 0.005 % ophthalmic solution Place 1 drop into both eyes at bedtime.        . metoprolol (TOPROL-XL) 100 MG 24 hr tablet Take 100 mg by mouth daily. Take one and half tablets daily        . Multiple Vitamin (MULTIVITAMIN) tablet Take 1 tablet by mouth daily.        . quinapril (ACCUPRIL) 20 MG tablet 1 1/2 tab qd  45 tablet  11  . vitamin C  (ASCORBIC ACID) 500 MG tablet Take 500 mg by mouth daily.        Marland Kitchen warfarin (COUMADIN) 4 MG tablet Take by mouth as directed.           Past Medical History  Diagnosis Date  . Atrial fibrillation   . Tricuspid regurgitation   . Mitral regurgitation   . Aortic insufficiency   . Hyperlipidemia   . HTN (hypertension)   . Prostate cancer     Past Surgical History  Procedure Date  . Appendectomy     History   Social History  . Marital Status: Married    Spouse Name: N/A    Number of Children: N/A  . Years of Education: N/A   Occupational History  . retired    Social History Main Topics  . Smoking status: Never Smoker   . Smokeless tobacco: Not on file  . Alcohol Use: Not on file  . Drug Use: No  . Sexually Active: Not on file   Other Topics Concern  . Not on file   Social History Narrative  . No narrative on file    ROS: arthralgias but no fevers  or chills, productive cough, hemoptysis, dysphasia, odynophagia, melena, hematochezia, dysuria, hematuria, rash, seizure activity, orthopnea, PND, pedal edema, claudication. Remaining systems are negative.  Physical Exam: Well-developed well-nourished in no acute distress.  Skin is warm and dry.  HEENT is normal.  Neck is supple. No thyromegaly.  Chest is clear to auscultation with normal expansion.  Cardiovascular exam is irregular Abdominal exam nontender or distended. No masses palpated. Extremities show no edema. neuro grossly intact  ECG Atrial fibrillation, RAD

## 2010-12-17 NOTE — Assessment & Plan Note (Signed)
Management per primary care. 

## 2010-12-17 NOTE — Assessment & Plan Note (Signed)
Continue present medications for rate control. Continue Coumadin with goal INR 2-3. Hemoglobin monitored by primary care. Discontinue aspirin given Coumadin use.

## 2010-12-17 NOTE — Assessment & Plan Note (Signed)
Blood pressure controlled. Potassium and renal function monitored by primary care.

## 2010-12-24 ENCOUNTER — Encounter: Payer: Federal, State, Local not specified - PPO | Admitting: *Deleted

## 2011-01-01 ENCOUNTER — Other Ambulatory Visit: Payer: Self-pay | Admitting: Cardiology

## 2011-01-17 ENCOUNTER — Ambulatory Visit (INDEPENDENT_AMBULATORY_CARE_PROVIDER_SITE_OTHER): Payer: Federal, State, Local not specified - PPO | Admitting: *Deleted

## 2011-01-17 DIAGNOSIS — Z7901 Long term (current) use of anticoagulants: Secondary | ICD-10-CM

## 2011-01-17 DIAGNOSIS — I4891 Unspecified atrial fibrillation: Secondary | ICD-10-CM

## 2011-01-17 LAB — POCT INR: INR: 2.8

## 2011-01-21 DIAGNOSIS — M5416 Radiculopathy, lumbar region: Secondary | ICD-10-CM

## 2011-01-21 HISTORY — DX: Radiculopathy, lumbar region: M54.16

## 2011-02-14 ENCOUNTER — Ambulatory Visit (INDEPENDENT_AMBULATORY_CARE_PROVIDER_SITE_OTHER): Payer: Federal, State, Local not specified - PPO | Admitting: *Deleted

## 2011-02-14 DIAGNOSIS — I4891 Unspecified atrial fibrillation: Secondary | ICD-10-CM

## 2011-02-14 DIAGNOSIS — Z7901 Long term (current) use of anticoagulants: Secondary | ICD-10-CM

## 2011-02-14 LAB — POCT INR: INR: 2.5

## 2011-03-28 ENCOUNTER — Ambulatory Visit (INDEPENDENT_AMBULATORY_CARE_PROVIDER_SITE_OTHER): Payer: Federal, State, Local not specified - PPO

## 2011-03-28 DIAGNOSIS — Z7901 Long term (current) use of anticoagulants: Secondary | ICD-10-CM

## 2011-03-28 DIAGNOSIS — I4891 Unspecified atrial fibrillation: Secondary | ICD-10-CM

## 2011-03-28 LAB — POCT INR: INR: 2.5

## 2011-04-16 ENCOUNTER — Other Ambulatory Visit: Payer: Self-pay | Admitting: Family Medicine

## 2011-04-16 ENCOUNTER — Ambulatory Visit
Admission: RE | Admit: 2011-04-16 | Discharge: 2011-04-16 | Disposition: A | Discharge status: Home / Self Care | Payer: Federal, State, Local not specified - PPO | Source: Ambulatory Visit | Attending: Family Medicine | Admitting: Family Medicine

## 2011-04-16 DIAGNOSIS — M545 Low back pain, unspecified: Secondary | ICD-10-CM

## 2011-05-09 ENCOUNTER — Ambulatory Visit (INDEPENDENT_AMBULATORY_CARE_PROVIDER_SITE_OTHER): Payer: Federal, State, Local not specified - PPO

## 2011-05-09 DIAGNOSIS — I4891 Unspecified atrial fibrillation: Secondary | ICD-10-CM

## 2011-05-09 DIAGNOSIS — Z7901 Long term (current) use of anticoagulants: Secondary | ICD-10-CM

## 2011-05-09 LAB — POCT INR: INR: 2.7

## 2011-05-23 ENCOUNTER — Other Ambulatory Visit: Payer: Self-pay | Admitting: Cardiology

## 2011-05-23 NOTE — Telephone Encounter (Signed)
Refilled quinapril

## 2011-06-05 ENCOUNTER — Telehealth: Payer: Self-pay | Admitting: Cardiology

## 2011-06-05 MED ORDER — WARFARIN SODIUM 4 MG PO TABS: 4.0000 mg | ORAL_TABLET | ORAL | Status: DC

## 2011-06-05 NOTE — Telephone Encounter (Signed)
New problem:  Per Christy calling need to discuss coumadin rx.  Patient will be follow by Dr. Sandi Mariscal. Patient states he told Alyse Low to contact Luisa Dago.

## 2011-06-20 ENCOUNTER — Ambulatory Visit (INDEPENDENT_AMBULATORY_CARE_PROVIDER_SITE_OTHER): Payer: Federal, State, Local not specified - PPO | Admitting: *Deleted

## 2011-06-20 DIAGNOSIS — I4891 Unspecified atrial fibrillation: Secondary | ICD-10-CM

## 2011-06-20 DIAGNOSIS — Z7901 Long term (current) use of anticoagulants: Secondary | ICD-10-CM

## 2011-08-01 ENCOUNTER — Ambulatory Visit (INDEPENDENT_AMBULATORY_CARE_PROVIDER_SITE_OTHER): Payer: Federal, State, Local not specified - PPO | Admitting: *Deleted

## 2011-08-01 DIAGNOSIS — I4891 Unspecified atrial fibrillation: Secondary | ICD-10-CM

## 2011-08-01 DIAGNOSIS — Z7901 Long term (current) use of anticoagulants: Secondary | ICD-10-CM

## 2011-08-18 ENCOUNTER — Encounter: Payer: Self-pay | Admitting: Cardiology

## 2011-08-22 ENCOUNTER — Ambulatory Visit (INDEPENDENT_AMBULATORY_CARE_PROVIDER_SITE_OTHER): Payer: Federal, State, Local not specified - PPO | Admitting: *Deleted

## 2011-08-22 DIAGNOSIS — I4891 Unspecified atrial fibrillation: Secondary | ICD-10-CM

## 2011-08-22 DIAGNOSIS — Z7901 Long term (current) use of anticoagulants: Secondary | ICD-10-CM

## 2011-08-25 ENCOUNTER — Encounter: Payer: Self-pay | Admitting: Cardiology

## 2011-09-19 ENCOUNTER — Ambulatory Visit (INDEPENDENT_AMBULATORY_CARE_PROVIDER_SITE_OTHER): Payer: Federal, State, Local not specified - PPO | Admitting: Pharmacist

## 2011-09-19 DIAGNOSIS — Z7901 Long term (current) use of anticoagulants: Secondary | ICD-10-CM

## 2011-09-19 DIAGNOSIS — I4891 Unspecified atrial fibrillation: Secondary | ICD-10-CM

## 2011-09-19 MED ORDER — DIGOXIN 125 MCG PO TABS: 125.0000 ug | ORAL_TABLET | Freq: Every day | ORAL | Status: DC

## 2011-09-23 ENCOUNTER — Encounter: Payer: Self-pay | Admitting: Internal Medicine

## 2011-09-29 ENCOUNTER — Other Ambulatory Visit: Payer: Self-pay | Admitting: Cardiology

## 2011-10-31 ENCOUNTER — Ambulatory Visit (INDEPENDENT_AMBULATORY_CARE_PROVIDER_SITE_OTHER): Payer: Federal, State, Local not specified - PPO | Admitting: *Deleted

## 2011-10-31 DIAGNOSIS — Z7901 Long term (current) use of anticoagulants: Secondary | ICD-10-CM

## 2011-10-31 DIAGNOSIS — I4891 Unspecified atrial fibrillation: Secondary | ICD-10-CM

## 2011-10-31 LAB — POCT INR: INR: 2.9

## 2011-11-04 ENCOUNTER — Encounter: Payer: Self-pay | Admitting: Cardiology

## 2011-12-04 ENCOUNTER — Ambulatory Visit (INDEPENDENT_AMBULATORY_CARE_PROVIDER_SITE_OTHER): Payer: Federal, State, Local not specified - PPO | Admitting: Cardiology

## 2011-12-04 ENCOUNTER — Encounter: Payer: Self-pay | Admitting: Cardiology

## 2011-12-04 VITALS — BP 152/91 | HR 70 | Ht 70.8 in | Wt 235.0 lb

## 2011-12-04 DIAGNOSIS — R609 Edema, unspecified: Secondary | ICD-10-CM

## 2011-12-04 DIAGNOSIS — Z7901 Long term (current) use of anticoagulants: Secondary | ICD-10-CM

## 2011-12-04 LAB — CBC WITH DIFFERENTIAL/PLATELET
Basophils Absolute: 0 10*3/uL (ref 0.0–0.1)
Eosinophils Absolute: 0.1 10*3/uL (ref 0.0–0.7)
Lymphocytes Relative: 23.9 % (ref 12.0–46.0)
MCHC: 32.9 g/dL (ref 30.0–36.0)
Neutrophils Relative %: 59.6 % (ref 43.0–77.0)
RBC: 4.82 Mil/uL (ref 4.22–5.81)
RDW: 14 % (ref 11.5–14.6)

## 2011-12-04 LAB — BRAIN NATRIURETIC PEPTIDE: Pro B Natriuretic peptide (BNP): 257 pg/mL — ABNORMAL HIGH (ref 0.0–100.0)

## 2011-12-04 LAB — BASIC METABOLIC PANEL
Calcium: 9.6 mg/dL (ref 8.4–10.5)
GFR: 48.45 mL/min — ABNORMAL LOW (ref >60.00)
Glucose, Bld: 105 mg/dL — ABNORMAL HIGH (ref 70–99)
Sodium: 138 mEq/L (ref 135–145)

## 2011-12-04 MED ORDER — LOSARTAN POTASSIUM 100 MG PO TABS: 100.0000 mg | ORAL_TABLET | Freq: Every day | ORAL | Status: DC

## 2011-12-04 MED ORDER — FUROSEMIDE 20 MG PO TABS: 20.0000 mg | ORAL_TABLET | Freq: Every day | ORAL | Status: DC

## 2011-12-04 NOTE — Assessment & Plan Note (Signed)
Continue present medications for rate control. Continue Coumadin with goal INR 2-3.

## 2011-12-04 NOTE — Assessment & Plan Note (Signed)
Continue statin. Lipids and liver monitored by primary care. 

## 2011-12-04 NOTE — Assessment & Plan Note (Signed)
Patient with increased lower extremity edema which is new compared to previous. Discontinue HCTZ. Check hemoglobin, renal function  And BNP. Repeat echocardiogram. Add Lasix 40 mg by mouth tomorrow and then 20 mg daily to follow. Repeat potassium and renal function in one week.

## 2011-12-04 NOTE — Assessment & Plan Note (Signed)
Blood pressure mildly elevated. Increase Cozaar to 100 mg daily. Check potassium and renal function in one week.

## 2011-12-04 NOTE — Patient Instructions (Addendum)
Your physician recommends that you schedule a follow-up appointment in: 3 MONTHS WITH DR Stanford Breed  Your physician recommends that you HAVE LAB WORK TODAY AND IN ONE WEEK  STOP HCTZ  START FUROSEMIDE 20 MG TAKE TWO TABLETS TOMORROW AND THEN ONE TABLET DAILY  INCREASE LOSARTAN TO 100 MG ONCE DAILY  Your physician has requested that you have an echocardiogram. Echocardiography is a painless test that uses sound waves to create images of your heart. It provides your doctor with information about the size and shape of your heart and how well your heart's chambers and valves are working. This procedure takes approximately one hour. There are no restrictions for this procedure.

## 2011-12-04 NOTE — Assessment & Plan Note (Signed)
Continue Coumadin with goal INR 2-3. Check hemoglobin.

## 2011-12-04 NOTE — Progress Notes (Signed)
HPI: Ronnie Sullivan is a pleasant gentleman who has a history of hypertension, hyperlipidemia, and atrial fibrillation. Note, he did have a Myoview performed on December 23, 2007 that showed an ejection fraction 64% with no ischemia or infarction. Last echocardiogram was performed in May of 2011 and showed normal LV function, trace aortic insufficiency, mild mitral regurgitation, and there was also biatrial enlargement. Previous TSH was normal. He has had a previous attempt at outpatient cardioversion. He transiently converted to sinus rhythm, but maintained sinus rhythm only for seconds. We have therefore elected to treat him with rate control and anticoagulation. A previous Holter monitor in February of 2010 showed that his rate was adequately controlled. I last saw him in Nov 2012. Since then he is doing well with no dyspnea on exertion, orthopnea, PND, palpitations, syncope or chest pain. He has noticed recently increased pedal edema. His blood pressure medications were adjusted by his primary care physician approximately one month ago.   Current Outpatient Prescriptions  Medication Sig Dispense Refill  . atorvastatin (LIPITOR) 10 MG tablet Take 10 mg by mouth daily.        . Bromfenac Sodium (BROMDAY) 0.09 % SOLN Apply to eye as needed.        . digoxin (LANOXIN) 0.125 MG tablet Take 1 tablet (125 mcg total) by mouth daily.  90 tablet  3  . fish oil-omega-3 fatty acids 1000 MG capsule Take 1,200 mg by mouth daily.        . hydrochlorothiazide (HYDRODIURIL) 50 MG tablet Take 25 mg by mouth daily.       Marland Kitchen latanoprost (XALATAN) 0.005 % ophthalmic solution Place 1 drop into both eyes at bedtime.        Marland Kitchen losartan (COZAAR) 50 MG tablet Take 50 mg by mouth daily.      . metoprolol succinate (TOPROL-XL) 100 MG 24 hr tablet TAKE ONE AND ONE-HALF TABLETS BY MOUTH DAILY  45 tablet  8  . Multiple Vitamin (MULTIVITAMIN) tablet Take 1 tablet by mouth daily.        . quinapril (ACCUPRIL) 20 MG tablet TAKE 1  AND 1/2 TABLETS BY MOUTH EVERY DAY  45 tablet  11  . vitamin C (ASCORBIC ACID) 500 MG tablet Take 500 mg by mouth daily.        Marland Kitchen warfarin (COUMADIN) 4 MG tablet Take 1 tablet (4 mg total) by mouth as directed.  156 tablet  6     Past Medical History  Diagnosis Date  . Atrial fibrillation   . Tricuspid regurgitation   . Mitral regurgitation   . Aortic insufficiency   . Hyperlipidemia   . HTN (hypertension)   . Prostate cancer     Past Surgical History  Procedure Date  . Appendectomy     History   Social History  . Marital Status: Married    Spouse Name: N/A    Number of Children: N/A  . Years of Education: N/A   Occupational History  . retired    Social History Main Topics  . Smoking status: Never Smoker   . Smokeless tobacco: Not on file  . Alcohol Use: Not on file  . Drug Use: No  . Sexually Active: Not on file   Other Topics Concern  . Not on file   Social History Narrative  . No narrative on file    ROS: no fevers or chills, productive cough, hemoptysis, dysphasia, odynophagia, melena, hematochezia, dysuria, hematuria, rash, seizure activity, orthopnea, PND,  claudication. Remaining  systems are negative.  Physical Exam: Well-developed well-nourished in no acute distress.  Skin is warm and dry.  HEENT is normal.  Neck is supple.  Chest is clear to auscultation with normal expansion.  Cardiovascular exam is irregular Abdominal exam nontender or distended. No masses palpated. Extremities show 1-2+ ankle edema. neuro grossly intact  ECG atrial fibrillation at a rate of 80.nonspecific ST changes.

## 2011-12-12 ENCOUNTER — Ambulatory Visit (INDEPENDENT_AMBULATORY_CARE_PROVIDER_SITE_OTHER): Payer: Federal, State, Local not specified - PPO | Admitting: *Deleted

## 2011-12-12 ENCOUNTER — Ambulatory Visit (HOSPITAL_COMMUNITY): Payer: Federal, State, Local not specified - PPO | Attending: Cardiology | Admitting: Radiology

## 2011-12-12 ENCOUNTER — Other Ambulatory Visit (INDEPENDENT_AMBULATORY_CARE_PROVIDER_SITE_OTHER): Payer: Federal, State, Local not specified - PPO

## 2011-12-12 DIAGNOSIS — I517 Cardiomegaly: Secondary | ICD-10-CM | POA: Insufficient documentation

## 2011-12-12 DIAGNOSIS — R609 Edema, unspecified: Secondary | ICD-10-CM | POA: Insufficient documentation

## 2011-12-12 DIAGNOSIS — I10 Essential (primary) hypertension: Secondary | ICD-10-CM | POA: Insufficient documentation

## 2011-12-12 DIAGNOSIS — I059 Rheumatic mitral valve disease, unspecified: Secondary | ICD-10-CM | POA: Insufficient documentation

## 2011-12-12 DIAGNOSIS — I379 Nonrheumatic pulmonary valve disorder, unspecified: Secondary | ICD-10-CM | POA: Insufficient documentation

## 2011-12-12 DIAGNOSIS — Z7901 Long term (current) use of anticoagulants: Secondary | ICD-10-CM

## 2011-12-12 DIAGNOSIS — I4891 Unspecified atrial fibrillation: Secondary | ICD-10-CM

## 2011-12-12 DIAGNOSIS — I079 Rheumatic tricuspid valve disease, unspecified: Secondary | ICD-10-CM | POA: Insufficient documentation

## 2011-12-12 DIAGNOSIS — E669 Obesity, unspecified: Secondary | ICD-10-CM | POA: Insufficient documentation

## 2011-12-12 DIAGNOSIS — E785 Hyperlipidemia, unspecified: Secondary | ICD-10-CM | POA: Insufficient documentation

## 2011-12-12 LAB — BASIC METABOLIC PANEL
Chloride: 101 mEq/L (ref 96–112)
Potassium: 3.7 mEq/L (ref 3.5–5.1)

## 2011-12-12 LAB — POCT INR: INR: 2.8

## 2011-12-12 NOTE — Progress Notes (Signed)
Echocardiogram performed.  

## 2012-01-05 ENCOUNTER — Telehealth: Payer: Self-pay | Admitting: Cardiology

## 2012-01-05 NOTE — Telephone Encounter (Signed)
New Problem:     Called in wanting to speak with someone about the patient.  Please call back.

## 2012-01-05 NOTE — Telephone Encounter (Signed)
Reviewed medications with pcp nurse

## 2012-01-23 ENCOUNTER — Ambulatory Visit (INDEPENDENT_AMBULATORY_CARE_PROVIDER_SITE_OTHER): Payer: Federal, State, Local not specified - PPO

## 2012-01-23 DIAGNOSIS — I4891 Unspecified atrial fibrillation: Secondary | ICD-10-CM

## 2012-01-23 DIAGNOSIS — Z7901 Long term (current) use of anticoagulants: Secondary | ICD-10-CM

## 2012-01-23 LAB — POCT INR: INR: 2.7

## 2012-02-06 ENCOUNTER — Ambulatory Visit: Payer: Federal, State, Local not specified - PPO | Admitting: Cardiology

## 2012-03-11 ENCOUNTER — Ambulatory Visit (INDEPENDENT_AMBULATORY_CARE_PROVIDER_SITE_OTHER): Payer: Federal, State, Local not specified - PPO | Admitting: *Deleted

## 2012-03-11 DIAGNOSIS — I4891 Unspecified atrial fibrillation: Secondary | ICD-10-CM

## 2012-03-11 DIAGNOSIS — Z7901 Long term (current) use of anticoagulants: Secondary | ICD-10-CM

## 2012-04-22 ENCOUNTER — Ambulatory Visit (INDEPENDENT_AMBULATORY_CARE_PROVIDER_SITE_OTHER): Payer: Federal, State, Local not specified - PPO | Admitting: *Deleted

## 2012-04-22 DIAGNOSIS — Z7901 Long term (current) use of anticoagulants: Secondary | ICD-10-CM

## 2012-04-22 DIAGNOSIS — I4891 Unspecified atrial fibrillation: Secondary | ICD-10-CM

## 2012-04-22 LAB — POCT INR: INR: 3.2

## 2012-04-23 ENCOUNTER — Encounter: Payer: Self-pay | Admitting: Cardiology

## 2012-05-04 ENCOUNTER — Other Ambulatory Visit: Payer: Self-pay | Admitting: *Deleted

## 2012-05-04 MED ORDER — WARFARIN SODIUM 4 MG PO TABS: ORAL_TABLET | ORAL | Status: DC

## 2012-05-20 ENCOUNTER — Ambulatory Visit (INDEPENDENT_AMBULATORY_CARE_PROVIDER_SITE_OTHER): Payer: Federal, State, Local not specified - PPO

## 2012-05-20 DIAGNOSIS — Z7901 Long term (current) use of anticoagulants: Secondary | ICD-10-CM

## 2012-05-20 DIAGNOSIS — I4891 Unspecified atrial fibrillation: Secondary | ICD-10-CM

## 2012-06-16 ENCOUNTER — Other Ambulatory Visit: Payer: Self-pay | Admitting: Cardiology

## 2012-06-24 ENCOUNTER — Ambulatory Visit (INDEPENDENT_AMBULATORY_CARE_PROVIDER_SITE_OTHER): Payer: Federal, State, Local not specified - PPO

## 2012-06-24 DIAGNOSIS — Z7901 Long term (current) use of anticoagulants: Secondary | ICD-10-CM

## 2012-06-24 DIAGNOSIS — I4891 Unspecified atrial fibrillation: Secondary | ICD-10-CM

## 2012-06-24 LAB — POCT INR: INR: 3.1

## 2012-07-29 ENCOUNTER — Ambulatory Visit (INDEPENDENT_AMBULATORY_CARE_PROVIDER_SITE_OTHER): Payer: Federal, State, Local not specified - PPO

## 2012-07-29 DIAGNOSIS — Z7901 Long term (current) use of anticoagulants: Secondary | ICD-10-CM

## 2012-07-29 DIAGNOSIS — I4891 Unspecified atrial fibrillation: Secondary | ICD-10-CM

## 2012-07-29 LAB — POCT INR: INR: 3.8

## 2012-08-19 ENCOUNTER — Ambulatory Visit (INDEPENDENT_AMBULATORY_CARE_PROVIDER_SITE_OTHER): Payer: Federal, State, Local not specified - PPO | Admitting: *Deleted

## 2012-08-19 DIAGNOSIS — Z7901 Long term (current) use of anticoagulants: Secondary | ICD-10-CM

## 2012-08-19 DIAGNOSIS — I4891 Unspecified atrial fibrillation: Secondary | ICD-10-CM

## 2012-08-28 ENCOUNTER — Other Ambulatory Visit: Payer: Self-pay | Admitting: Cardiology

## 2012-09-03 ENCOUNTER — Other Ambulatory Visit: Payer: Self-pay | Admitting: *Deleted

## 2012-09-06 ENCOUNTER — Other Ambulatory Visit: Payer: Self-pay | Admitting: Cardiology

## 2012-09-13 ENCOUNTER — Telehealth: Payer: Self-pay | Admitting: Internal Medicine

## 2012-09-13 NOTE — Telephone Encounter (Signed)
Patient missed his recall in 2011 and is now 45.  He is also on coumadin.  He will come in and discuss colon 10/15/12

## 2012-09-16 ENCOUNTER — Ambulatory Visit (INDEPENDENT_AMBULATORY_CARE_PROVIDER_SITE_OTHER): Payer: Federal, State, Local not specified - PPO

## 2012-09-16 DIAGNOSIS — Z7901 Long term (current) use of anticoagulants: Secondary | ICD-10-CM

## 2012-09-16 DIAGNOSIS — I4891 Unspecified atrial fibrillation: Secondary | ICD-10-CM

## 2012-10-14 ENCOUNTER — Ambulatory Visit (INDEPENDENT_AMBULATORY_CARE_PROVIDER_SITE_OTHER): Payer: Federal, State, Local not specified - PPO

## 2012-10-14 DIAGNOSIS — Z7901 Long term (current) use of anticoagulants: Secondary | ICD-10-CM

## 2012-10-14 DIAGNOSIS — I4891 Unspecified atrial fibrillation: Secondary | ICD-10-CM

## 2012-10-15 ENCOUNTER — Encounter: Payer: Self-pay | Admitting: Internal Medicine

## 2012-10-15 ENCOUNTER — Ambulatory Visit (INDEPENDENT_AMBULATORY_CARE_PROVIDER_SITE_OTHER): Payer: Federal, State, Local not specified - PPO | Admitting: Internal Medicine

## 2012-10-15 VITALS — BP 120/80 | HR 72 | Ht 69.0 in | Wt 227.2 lb

## 2012-10-15 DIAGNOSIS — Z86010 Personal history of colonic polyps: Secondary | ICD-10-CM

## 2012-10-15 NOTE — Patient Instructions (Addendum)
You have decided to not pursue further routine colonoscopy.  If you develop persistent bowel habit changes, new abdominal pain, or rectal bleeding please let me know as this could mean you are having significant colon problems that require medical attention.  I appreciate the opportunity to care for you.  Gatha Mayer, MD, [facility]

## 2012-10-15 NOTE — Progress Notes (Signed)
  Subjective:    Patient ID: Ronnie Sullivan, male    DOB: 31-Jan-1928, 79 y.o.   MRN: [id number]  HPI The patient is a very pleasant 79 year old white man with a history of an adenomatous colon polyp removed in 2006. It was a 12 mm pedunculated polyp completely removed. He is here to discuss having a repeat colonoscopy. He tells me he's not inclined to do so. He has no active GI symptoms at this time. He did not return for repeat colonoscopy in his 79s because he was dealing with prostate cancer.  No Known Allergies Outpatient Prescriptions Prior to Visit  Medication Sig Dispense Refill  . amLODipine (NORVASC) 10 MG tablet Take 10 mg by mouth daily.      . digoxin (LANOXIN) 0.125 MG tablet TAKE 1 TABLET (125 MCG TOTAL) BY MOUTH DAILY.  90 tablet  3  . fish oil-omega-3 fatty acids 1000 MG capsule Take 1,200 mg by mouth daily.        Marland Kitchen latanoprost (XALATAN) 0.005 % ophthalmic solution Place 1 drop into both eyes at bedtime.        Marland Kitchen losartan (COZAAR) 100 MG tablet Take 1 tablet (100 mg total) by mouth daily.  90 tablet  4  . metoprolol succinate (TOPROL-XL) 100 MG 24 hr tablet TAKE ONE AND ONE-HALF TABLETS BY MOUTH DAILY  45 tablet  8  . Multiple Vitamin (MULTIVITAMIN) tablet Take 1 tablet by mouth daily.        Marland Kitchen torsemide (DEMADEX) 20 MG tablet Take 20 mg by mouth daily.      . vitamin C (ASCORBIC ACID) 500 MG tablet Take 500 mg by mouth daily.        Marland Kitchen warfarin (COUMADIN) 4 MG tablet TAKE AS DIRECTED BY COUMADIN CLINIC  180 tablet  0   No facility-administered medications prior to visit.   Past Medical History  Diagnosis Date  . Atrial fibrillation   . Tricuspid regurgitation   . Mitral regurgitation   . Aortic insufficiency   . Hyperlipidemia   . HTN (hypertension)   . Prostate cancer     XRT  . Diverticulosis   . External hemorrhoids   . Tubular adenoma of colon 11/2004  . CKD (chronic kidney disease)   . Hearing loss of both ears 2012  . Macular degeneration 2012    right   . Left lumbar radiculopathy 2013  . Glucose intolerance (impaired glucose tolerance)   . Glaucoma 2010   Past Surgical History  Procedure Laterality Date  . Appendectomy    . Colonoscopy  2006  . Cataract extraction  2008  . Elbow fracture surgery  1995    Review of Systems Having a basal cell carcinoma removed in Oct 2014    Objective:   Physical Exam WDWN NAD looks younger than 103    Assessment & Plan:  Personal history of colonic polyps  I appreciate the opportunity to care for this patient. CC: Marylene Land, MD

## 2012-10-15 NOTE — Assessment & Plan Note (Signed)
Is not inclined to pursue routine repeat colonoscopy. I think that is very reasonable at his age and given his overall comorbidities. He and his wife both understand that he could still develop colon cancer though given the removal of the single adenomatous polyp in 2006, that really is overall less likely. I've advised him to return for signs or symptoms.

## 2012-11-25 ENCOUNTER — Ambulatory Visit (INDEPENDENT_AMBULATORY_CARE_PROVIDER_SITE_OTHER): Payer: Federal, State, Local not specified - PPO | Admitting: General Practice

## 2012-11-25 DIAGNOSIS — Z7901 Long term (current) use of anticoagulants: Secondary | ICD-10-CM

## 2012-11-25 DIAGNOSIS — I4891 Unspecified atrial fibrillation: Secondary | ICD-10-CM

## 2012-11-25 LAB — POCT INR: INR: 2.7

## 2012-12-20 ENCOUNTER — Other Ambulatory Visit: Payer: Self-pay | Admitting: Cardiology

## 2013-01-06 ENCOUNTER — Ambulatory Visit (INDEPENDENT_AMBULATORY_CARE_PROVIDER_SITE_OTHER): Payer: Federal, State, Local not specified - PPO | Admitting: *Deleted

## 2013-01-06 DIAGNOSIS — I4891 Unspecified atrial fibrillation: Secondary | ICD-10-CM

## 2013-01-06 DIAGNOSIS — Z7901 Long term (current) use of anticoagulants: Secondary | ICD-10-CM

## 2013-02-17 ENCOUNTER — Ambulatory Visit (INDEPENDENT_AMBULATORY_CARE_PROVIDER_SITE_OTHER): Payer: Federal, State, Local not specified - PPO

## 2013-02-17 DIAGNOSIS — Z5181 Encounter for therapeutic drug level monitoring: Secondary | ICD-10-CM

## 2013-02-17 DIAGNOSIS — I4891 Unspecified atrial fibrillation: Secondary | ICD-10-CM

## 2013-02-17 DIAGNOSIS — Z7901 Long term (current) use of anticoagulants: Secondary | ICD-10-CM

## 2013-02-17 LAB — POCT INR: INR: 3

## 2013-03-18 ENCOUNTER — Other Ambulatory Visit: Payer: Self-pay | Admitting: *Deleted

## 2013-03-18 MED ORDER — METOPROLOL SUCCINATE ER 100 MG PO TB24: ORAL_TABLET | ORAL | Status: DC

## 2013-03-31 ENCOUNTER — Ambulatory Visit (INDEPENDENT_AMBULATORY_CARE_PROVIDER_SITE_OTHER): Payer: Federal, State, Local not specified - PPO | Admitting: *Deleted

## 2013-03-31 DIAGNOSIS — I4891 Unspecified atrial fibrillation: Secondary | ICD-10-CM

## 2013-03-31 DIAGNOSIS — Z7901 Long term (current) use of anticoagulants: Secondary | ICD-10-CM

## 2013-03-31 DIAGNOSIS — Z5181 Encounter for therapeutic drug level monitoring: Secondary | ICD-10-CM

## 2013-03-31 LAB — POCT INR: INR: 2.7

## 2013-05-10 ENCOUNTER — Ambulatory Visit (INDEPENDENT_AMBULATORY_CARE_PROVIDER_SITE_OTHER): Payer: Federal, State, Local not specified - PPO | Admitting: Cardiology

## 2013-05-10 ENCOUNTER — Ambulatory Visit (INDEPENDENT_AMBULATORY_CARE_PROVIDER_SITE_OTHER): Payer: Federal, State, Local not specified - PPO | Admitting: Pharmacist

## 2013-05-10 ENCOUNTER — Encounter: Payer: Self-pay | Admitting: Cardiology

## 2013-05-10 VITALS — BP 132/64 | HR 73 | Ht 69.0 in | Wt 231.0 lb

## 2013-05-10 DIAGNOSIS — I4891 Unspecified atrial fibrillation: Secondary | ICD-10-CM

## 2013-05-10 DIAGNOSIS — R609 Edema, unspecified: Secondary | ICD-10-CM

## 2013-05-10 DIAGNOSIS — E785 Hyperlipidemia, unspecified: Secondary | ICD-10-CM

## 2013-05-10 DIAGNOSIS — Z5181 Encounter for therapeutic drug level monitoring: Secondary | ICD-10-CM

## 2013-05-10 DIAGNOSIS — Z7901 Long term (current) use of anticoagulants: Secondary | ICD-10-CM

## 2013-05-10 LAB — CBC WITH DIFFERENTIAL/PLATELET
Basophils Absolute: 0 10*3/uL (ref 0.0–0.1)
Basophils Relative: 0.3 % (ref 0.0–3.0)
EOS PCT: 1.1 % (ref 0.0–5.0)
Eosinophils Absolute: 0.1 10*3/uL (ref 0.0–0.7)
HEMATOCRIT: 47.3 % (ref 39.0–52.0)
Hemoglobin: 16 g/dL (ref 13.0–17.0)
LYMPHS ABS: 1.9 10*3/uL (ref 0.7–4.0)
Lymphocytes Relative: 29.6 % (ref 12.0–46.0)
MCHC: 33.7 g/dL (ref 30.0–36.0)
MCV: 94.6 fl (ref 78.0–100.0)
MONOS PCT: 15 % — AB (ref 3.0–12.0)
Monocytes Absolute: 0.9 10*3/uL (ref 0.1–1.0)
Neutro Abs: 3.4 10*3/uL (ref 1.4–7.7)
Neutrophils Relative %: 54 % (ref 43.0–77.0)
Platelets: 177 10*3/uL (ref 150.0–400.0)
RBC: 5.01 Mil/uL (ref 4.22–5.81)
RDW: 14.6 % (ref 11.5–14.6)
WBC: 6.3 10*3/uL (ref 4.5–10.5)

## 2013-05-10 LAB — BASIC METABOLIC PANEL
BUN: 19 mg/dL (ref 6–23)
CO2: 31 meq/L (ref 19–32)
Calcium: 10.1 mg/dL (ref 8.4–10.5)
Chloride: 100 mEq/L (ref 96–112)
Creatinine, Ser: 1.5 mg/dL (ref 0.4–1.5)
GFR: 49.04 mL/min — ABNORMAL LOW (ref >60.00)
GLUCOSE: 98 mg/dL (ref 70–99)
Potassium: 3.8 mEq/L (ref 3.5–5.1)
SODIUM: 140 meq/L (ref 135–145)

## 2013-05-10 LAB — POCT INR: INR: 2.5

## 2013-05-10 NOTE — Progress Notes (Signed)
HPI: Mr. Armbrecht is a pleasant gentleman who has a history of hypertension, hyperlipidemia, and atrial fibrillation. Note, he did have a Myoview performed on December 23, 2007 that showed an ejection fraction 64% with no ischemia or infarction. Last echocardiogram was performed in November 2013 and revealed normal LV function, moderate biatrial enlargement, mild right ventricular enlargement, mild mitral regurgitation and trace aortic insufficiency. Previous TSH was normal. He has had a previous attempt at outpatient cardioversion. He transiently converted to sinus rhythm, but maintained sinus rhythm only for seconds. We have therefore elected to treat him with rate control and anticoagulation. A previous Holter monitor in February of 2010 showed that his rate was adequately controlled. I last saw him in Nov 2013. Since then He denies dyspnea, chest pain, palpitations, syncope or bleeding. Chronic mild pedal edema.   Current Outpatient Prescriptions  Medication Sig Dispense Refill  . amLODipine (NORVASC) 10 MG tablet Take 10 mg by mouth daily.      . digoxin (LANOXIN) 0.125 MG tablet TAKE 1 TABLET (125 MCG TOTAL) BY MOUTH DAILY.  90 tablet  3  . fish oil-omega-3 fatty acids 1000 MG capsule Take 1,200 mg by mouth daily.        Marland Kitchen latanoprost (XALATAN) 0.005 % ophthalmic solution Place 1 drop into both eyes at bedtime.        Marland Kitchen losartan (COZAAR) 100 MG tablet Take 1 tablet (100 mg total) by mouth daily.  90 tablet  4  . metoprolol succinate (TOPROL-XL) 100 MG 24 hr tablet TAKE ONE AND ONE-HALF TABLETS BY MOUTH DAILY  45 tablet  1  . Multiple Vitamin (MULTIVITAMIN) tablet Take 1 tablet by mouth daily.        Marland Kitchen torsemide (DEMADEX) 20 MG tablet Take 20 mg by mouth daily.      . vitamin C (ASCORBIC ACID) 500 MG tablet Take 500 mg by mouth daily.        Marland Kitchen warfarin (COUMADIN) 4 MG tablet TAKE AS DIRECTED BY COUMADIN CLINIC  180 tablet  1   No current facility-administered medications for this  visit.     Past Medical History  Diagnosis Date  . Atrial fibrillation   . Tricuspid regurgitation   . Mitral regurgitation   . Aortic insufficiency   . Hyperlipidemia   . HTN (hypertension)   . Prostate cancer     XRT  . Diverticulosis   . External hemorrhoids   . Tubular adenoma of colon 11/2004  . CKD (chronic kidney disease)   . Hearing loss of both ears 2012  . Macular degeneration 2012    right  . Left lumbar radiculopathy 2013  . Glucose intolerance (impaired glucose tolerance)   . Glaucoma 2010    Past Surgical History  Procedure Laterality Date  . Appendectomy    . Colonoscopy  2006  . Cataract extraction  2008  . Elbow fracture surgery  1995    History   Social History  . Marital Status: Married    Spouse Name: N/A    Number of Children: N/A  . Years of Education: N/A   Occupational History  . retired    Social History Main Topics  . Smoking status: Never Smoker   . Smokeless tobacco: Never Used  . Alcohol Use: 3.0 oz/week    5 Glasses of wine per week     Comment: every afternoon  . Drug Use: No  . Sexual Activity: Not on file   Other Topics Concern  .  Not on file   Social History Narrative  . No narrative on file    ROS: no fevers or chills, productive cough, hemoptysis, dysphasia, odynophagia, melena, hematochezia, dysuria, hematuria, rash, seizure activity, orthopnea, PND, pedal edema, claudication. Remaining systems are negative.  Physical Exam: Well-developed well-nourished in no acute distress.  Skin is warm and dry.  HEENT is normal.  Neck is supple.  Chest is clear to auscultation with normal expansion.  Cardiovascular exam is irregular Abdominal exam nontender or distended. No masses palpated. Extremities show 1+ edema. neuro grossly intact  ECG Atrial fibrillation at a rate of 73. Occasional PVC or aberrantly conducted beats. Right axis deviation. Nonspecific ST changes.

## 2013-05-10 NOTE — Assessment & Plan Note (Signed)
Patient remains in permanent atrial fibrillation. Continue present medications for rate control. Continue Coumadin. Check CBC. We have provided the name of apixaban. He will check about the cost with his insurance company and we will change if he is interested.

## 2013-05-10 NOTE — Patient Instructions (Signed)
Your physician wants you to follow-up in: [place] will receive a reminder letter in the mail two months in advance. If you don't receive a letter, please call our office to schedule the follow-up appointment.   Your physician recommends that you HAVE [facility]

## 2013-05-10 NOTE — Assessment & Plan Note (Signed)
Blood pressure controlled. Continue present medications. Check potassium and renal function. 

## 2013-05-10 NOTE — Assessment & Plan Note (Signed)
Continue present dose of diuretics. Check potassium and renal function. 

## 2013-05-10 NOTE — Assessment & Plan Note (Signed)
Management per primary care. 

## 2013-05-11 ENCOUNTER — Other Ambulatory Visit: Payer: Self-pay | Admitting: *Deleted

## 2013-05-11 DIAGNOSIS — R609 Edema, unspecified: Secondary | ICD-10-CM

## 2013-05-11 MED ORDER — WARFARIN SODIUM 4 MG PO TABS: ORAL_TABLET | ORAL | Status: DC

## 2013-05-11 MED ORDER — AMLODIPINE BESYLATE 10 MG PO TABS: 10.0000 mg | ORAL_TABLET | Freq: Every day | ORAL | Status: DC

## 2013-05-11 MED ORDER — METOPROLOL SUCCINATE ER 100 MG PO TB24: ORAL_TABLET | ORAL | Status: DC

## 2013-05-11 MED ORDER — LOSARTAN POTASSIUM 100 MG PO TABS: 100.0000 mg | ORAL_TABLET | Freq: Every day | ORAL | Status: AC

## 2013-05-11 MED ORDER — TORSEMIDE 20 MG PO TABS: 20.0000 mg | ORAL_TABLET | Freq: Every day | ORAL | Status: DC

## 2013-05-11 MED ORDER — DIGOXIN 125 MCG PO TABS: 0.1250 mg | ORAL_TABLET | Freq: Every day | ORAL | Status: DC

## 2013-06-23 ENCOUNTER — Ambulatory Visit (INDEPENDENT_AMBULATORY_CARE_PROVIDER_SITE_OTHER): Payer: Federal, State, Local not specified - PPO

## 2013-06-23 DIAGNOSIS — Z7901 Long term (current) use of anticoagulants: Secondary | ICD-10-CM

## 2013-06-23 DIAGNOSIS — I4891 Unspecified atrial fibrillation: Secondary | ICD-10-CM

## 2013-06-23 DIAGNOSIS — Z5181 Encounter for therapeutic drug level monitoring: Secondary | ICD-10-CM

## 2013-06-23 LAB — POCT INR: INR: 2.5

## 2013-07-12 ENCOUNTER — Other Ambulatory Visit: Payer: Self-pay | Admitting: *Deleted

## 2013-07-12 MED ORDER — WARFARIN SODIUM 4 MG PO TABS: ORAL_TABLET | ORAL | Status: DC

## 2013-07-14 ENCOUNTER — Other Ambulatory Visit: Payer: Self-pay

## 2013-07-14 MED ORDER — METOPROLOL SUCCINATE ER 100 MG PO TB24: ORAL_TABLET | ORAL | Status: DC

## 2013-08-04 ENCOUNTER — Ambulatory Visit (INDEPENDENT_AMBULATORY_CARE_PROVIDER_SITE_OTHER): Payer: Federal, State, Local not specified - PPO

## 2013-08-04 DIAGNOSIS — Z7901 Long term (current) use of anticoagulants: Secondary | ICD-10-CM

## 2013-08-04 DIAGNOSIS — Z5181 Encounter for therapeutic drug level monitoring: Secondary | ICD-10-CM

## 2013-08-04 DIAGNOSIS — I4891 Unspecified atrial fibrillation: Secondary | ICD-10-CM

## 2013-08-04 LAB — POCT INR: INR: 2.1

## 2013-09-15 ENCOUNTER — Ambulatory Visit (INDEPENDENT_AMBULATORY_CARE_PROVIDER_SITE_OTHER): Payer: Federal, State, Local not specified - PPO | Admitting: *Deleted

## 2013-09-15 DIAGNOSIS — I4891 Unspecified atrial fibrillation: Secondary | ICD-10-CM

## 2013-09-15 DIAGNOSIS — Z5181 Encounter for therapeutic drug level monitoring: Secondary | ICD-10-CM

## 2013-09-15 DIAGNOSIS — Z7901 Long term (current) use of anticoagulants: Secondary | ICD-10-CM

## 2013-09-15 LAB — POCT INR: INR: 3.8

## 2013-09-30 ENCOUNTER — Ambulatory Visit (INDEPENDENT_AMBULATORY_CARE_PROVIDER_SITE_OTHER): Payer: Federal, State, Local not specified - PPO | Admitting: *Deleted

## 2013-09-30 DIAGNOSIS — Z7901 Long term (current) use of anticoagulants: Secondary | ICD-10-CM

## 2013-09-30 DIAGNOSIS — Z5181 Encounter for therapeutic drug level monitoring: Secondary | ICD-10-CM

## 2013-09-30 DIAGNOSIS — I4891 Unspecified atrial fibrillation: Secondary | ICD-10-CM

## 2013-09-30 LAB — POCT INR: INR: 3.6

## 2013-10-13 ENCOUNTER — Ambulatory Visit (INDEPENDENT_AMBULATORY_CARE_PROVIDER_SITE_OTHER): Payer: Federal, State, Local not specified - PPO

## 2013-10-13 DIAGNOSIS — Z7901 Long term (current) use of anticoagulants: Secondary | ICD-10-CM

## 2013-10-13 DIAGNOSIS — Z5181 Encounter for therapeutic drug level monitoring: Secondary | ICD-10-CM

## 2013-10-13 DIAGNOSIS — I4891 Unspecified atrial fibrillation: Secondary | ICD-10-CM

## 2013-10-13 LAB — POCT INR: INR: 3

## 2013-11-03 ENCOUNTER — Ambulatory Visit (INDEPENDENT_AMBULATORY_CARE_PROVIDER_SITE_OTHER): Payer: Federal, State, Local not specified - PPO | Admitting: *Deleted

## 2013-11-03 DIAGNOSIS — Z7901 Long term (current) use of anticoagulants: Secondary | ICD-10-CM

## 2013-11-03 DIAGNOSIS — Z5181 Encounter for therapeutic drug level monitoring: Secondary | ICD-10-CM

## 2013-11-03 DIAGNOSIS — I4891 Unspecified atrial fibrillation: Secondary | ICD-10-CM

## 2013-11-03 LAB — POCT INR: INR: 2.8

## 2013-12-01 ENCOUNTER — Ambulatory Visit (INDEPENDENT_AMBULATORY_CARE_PROVIDER_SITE_OTHER): Payer: Federal, State, Local not specified - PPO | Admitting: Pharmacist Clinician (PhC)/ Clinical Pharmacy Specialist

## 2013-12-01 DIAGNOSIS — I4891 Unspecified atrial fibrillation: Secondary | ICD-10-CM

## 2013-12-01 DIAGNOSIS — Z7901 Long term (current) use of anticoagulants: Secondary | ICD-10-CM

## 2013-12-01 DIAGNOSIS — Z5181 Encounter for therapeutic drug level monitoring: Secondary | ICD-10-CM

## 2013-12-01 LAB — POCT INR: INR: 3

## 2013-12-14 ENCOUNTER — Other Ambulatory Visit: Payer: Self-pay | Admitting: *Deleted

## 2013-12-14 MED ORDER — WARFARIN SODIUM 4 MG PO TABS: ORAL_TABLET | ORAL | Status: DC

## 2013-12-29 ENCOUNTER — Ambulatory Visit (INDEPENDENT_AMBULATORY_CARE_PROVIDER_SITE_OTHER): Payer: Federal, State, Local not specified - PPO | Admitting: Pharmacist

## 2013-12-29 DIAGNOSIS — I4891 Unspecified atrial fibrillation: Secondary | ICD-10-CM

## 2013-12-29 DIAGNOSIS — Z5181 Encounter for therapeutic drug level monitoring: Secondary | ICD-10-CM

## 2013-12-29 DIAGNOSIS — Z7901 Long term (current) use of anticoagulants: Secondary | ICD-10-CM

## 2013-12-29 LAB — POCT INR: INR: 3

## 2014-02-09 ENCOUNTER — Ambulatory Visit (INDEPENDENT_AMBULATORY_CARE_PROVIDER_SITE_OTHER): Payer: Federal, State, Local not specified - PPO

## 2014-02-09 DIAGNOSIS — Z5181 Encounter for therapeutic drug level monitoring: Secondary | ICD-10-CM

## 2014-02-09 DIAGNOSIS — Z7901 Long term (current) use of anticoagulants: Secondary | ICD-10-CM

## 2014-02-09 DIAGNOSIS — I4891 Unspecified atrial fibrillation: Secondary | ICD-10-CM

## 2014-02-09 LAB — POCT INR: INR: 3

## 2014-03-23 ENCOUNTER — Ambulatory Visit (INDEPENDENT_AMBULATORY_CARE_PROVIDER_SITE_OTHER): Payer: Federal, State, Local not specified - PPO | Admitting: *Deleted

## 2014-03-23 DIAGNOSIS — I4891 Unspecified atrial fibrillation: Secondary | ICD-10-CM

## 2014-03-23 DIAGNOSIS — Z7901 Long term (current) use of anticoagulants: Secondary | ICD-10-CM

## 2014-03-23 DIAGNOSIS — Z5181 Encounter for therapeutic drug level monitoring: Secondary | ICD-10-CM

## 2014-03-23 LAB — POCT INR: INR: 3.2

## 2014-04-11 ENCOUNTER — Other Ambulatory Visit: Payer: Self-pay

## 2014-04-11 MED ORDER — METOPROLOL SUCCINATE ER 100 MG PO TB24: ORAL_TABLET | ORAL | Status: DC

## 2014-05-04 ENCOUNTER — Ambulatory Visit (INDEPENDENT_AMBULATORY_CARE_PROVIDER_SITE_OTHER): Payer: Federal, State, Local not specified - PPO | Admitting: Surgery

## 2014-05-04 DIAGNOSIS — Z7901 Long term (current) use of anticoagulants: Secondary | ICD-10-CM

## 2014-05-04 DIAGNOSIS — I4891 Unspecified atrial fibrillation: Secondary | ICD-10-CM | POA: Diagnosis not present

## 2014-05-04 DIAGNOSIS — Z5181 Encounter for therapeutic drug level monitoring: Secondary | ICD-10-CM | POA: Diagnosis not present

## 2014-05-04 LAB — POCT INR: INR: 2.8

## 2014-05-05 ENCOUNTER — Telehealth: Payer: Self-pay

## 2014-05-05 MED ORDER — WARFARIN SODIUM 4 MG PO TABS: ORAL_TABLET | ORAL | Status: DC

## 2014-05-05 NOTE — Telephone Encounter (Signed)
Warfarin refilled rx sent to pharmacy.

## 2014-05-08 NOTE — Progress Notes (Signed)
HPI: FU hypertension, hyperlipidemia, and atrial fibrillation. Note, he did have a Myoview performed on December 23, 2007 that showed an ejection fraction 64% with no ischemia or infarction. Last echocardiogram was performed in November 2013 and revealed normal LV function, moderate biatrial enlargement, mild right ventricular enlargement, mild mitral regurgitation and trace aortic insufficiency. Previous TSH was normal. He has had a previous attempt at outpatient cardioversion. He transiently converted to sinus rhythm, but maintained sinus rhythm only for seconds. We have therefore elected to treat him with rate control and anticoagulation. A previous Holter monitor in February of 2010 showed that his rate was adequately controlled. Since I last saw him, the patient denies any dyspnea on exertion, orthopnea, PND, palpitations, syncope or chest pain. Chronic mild pedal edema.   Current Outpatient Prescriptions  Medication Sig Dispense Refill  . amLODipine (NORVASC) 10 MG tablet Take 1 tablet (10 mg total) by mouth daily. 30 tablet 12  . atorvastatin (LIPITOR) 10 MG tablet Take 10 mg by mouth daily.    . digoxin (LANOXIN) 0.125 MG tablet Take 1 tablet (0.125 mg total) by mouth daily. 30 tablet 12  . fish oil-omega-3 fatty acids 1000 MG capsule Take 1,200 mg by mouth daily.      Marland Kitchen latanoprost (XALATAN) 0.005 % ophthalmic solution Place 1 drop into both eyes at bedtime.      Marland Kitchen losartan (COZAAR) 100 MG tablet Take 1 tablet (100 mg total) by mouth daily. 90 tablet 4  . metoprolol succinate (TOPROL-XL) 100 MG 24 hr tablet TAKE ONE AND ONE-HALF TABLETS BY MOUTH DAILY 135 tablet 2  . Multiple Vitamin (MULTIVITAMIN) tablet Take 1 tablet by mouth daily.      Marland Kitchen torsemide (DEMADEX) 20 MG tablet Take 1 tablet (20 mg total) by mouth daily. 30 tablet 12  . vitamin C (ASCORBIC ACID) 500 MG tablet Take 500 mg by mouth daily.      Marland Kitchen warfarin (COUMADIN) 4 MG tablet TAKE AS DIRECTED BY COUMADIN CLINIC 60 tablet  3   No current facility-administered medications for this visit.     Past Medical History  Diagnosis Date  . Atrial fibrillation   . Tricuspid regurgitation   . Mitral regurgitation   . Aortic insufficiency   . Hyperlipidemia   . HTN (hypertension)   . Prostate cancer     XRT  . Diverticulosis   . External hemorrhoids   . Tubular adenoma of colon 11/2004  . CKD (chronic kidney disease)   . Hearing loss of both ears 2012  . Macular degeneration 2012    right  . Left lumbar radiculopathy 2013  . Glucose intolerance (impaired glucose tolerance)   . Glaucoma 2010    Past Surgical History  Procedure Laterality Date  . Appendectomy    . Colonoscopy  2006  . Cataract extraction  2008  . Elbow fracture surgery  1995    History   Social History  . Marital Status: Married    Spouse Name: N/A  . Number of Children: N/A  . Years of Education: N/A   Occupational History  . retired    Social History Main Topics  . Smoking status: Never Smoker   . Smokeless tobacco: Never Used  . Alcohol Use: 3.0 oz/week    5 Glasses of wine per week     Comment: every afternoon  . Drug Use: No  . Sexual Activity: Not on file   Other Topics Concern  . Not on file  Social History Narrative    ROS: no fevers or chills, productive cough, hemoptysis, dysphasia, odynophagia, melena, hematochezia, dysuria, hematuria, rash, seizure activity, orthopnea, PND, claudication. Remaining systems are negative.  Physical Exam: Well-developed well-nourished in no acute distress.  Skin is warm and dry.  HEENT is normal.  Neck is supple.  Chest is clear to auscultation with normal expansion.  Cardiovascular exam is irregular Abdominal exam nontender or distended. No masses palpated. Extremities show 1+ edema. neuro grossly intact  ECG atrial fibrillation, nonspecific ST changes.

## 2014-05-12 ENCOUNTER — Ambulatory Visit (INDEPENDENT_AMBULATORY_CARE_PROVIDER_SITE_OTHER): Payer: Federal, State, Local not specified - PPO | Admitting: Cardiology

## 2014-05-12 ENCOUNTER — Encounter: Payer: Self-pay | Admitting: Cardiology

## 2014-05-12 VITALS — BP 152/88 | HR 91 | Ht 69.0 in | Wt 228.0 lb

## 2014-05-12 DIAGNOSIS — I482 Chronic atrial fibrillation, unspecified: Secondary | ICD-10-CM

## 2014-05-12 DIAGNOSIS — R609 Edema, unspecified: Secondary | ICD-10-CM

## 2014-05-12 DIAGNOSIS — I10 Essential (primary) hypertension: Secondary | ICD-10-CM

## 2014-05-12 LAB — CBC
HEMATOCRIT: 48.6 % (ref 39.0–52.0)
HEMOGLOBIN: 15.9 g/dL (ref 13.0–17.0)
MCH: 32.2 pg (ref 26.0–34.0)
MCHC: 32.7 g/dL (ref 30.0–36.0)
MCV: 98.4 fL (ref 78.0–100.0)
MPV: 11.8 fL (ref 8.6–12.4)
Platelets: 207 10*3/uL (ref 150–400)
RBC: 4.94 MIL/uL (ref 4.22–5.81)
RDW: 14.3 % (ref 11.5–15.5)
WBC: 6.8 10*3/uL (ref 4.0–10.5)

## 2014-05-12 LAB — BASIC METABOLIC PANEL WITH GFR
BUN: 23 mg/dL (ref 6–23)
CHLORIDE: 101 meq/L (ref 96–112)
CO2: 29 mEq/L (ref 19–32)
CREATININE: 1.5 mg/dL — AB (ref 0.50–1.35)
Calcium: 10 mg/dL (ref 8.4–10.5)
GFR, EST NON AFRICAN AMERICAN: 42 mL/min — AB
GFR, Est African American: 49 mL/min — ABNORMAL LOW
Glucose, Bld: 92 mg/dL (ref 70–99)
POTASSIUM: 4.3 meq/L (ref 3.5–5.3)
SODIUM: 141 meq/L (ref 135–145)

## 2014-05-12 NOTE — Assessment & Plan Note (Signed)
Continue statin. Lipids and liver monitored by primary care. 

## 2014-05-12 NOTE — Assessment & Plan Note (Signed)
Continue present dose of Lasix. Check potassium and renal function. 

## 2014-05-12 NOTE — Patient Instructions (Signed)
Your physician wants you to follow-up in: ONE YEAR WITH DR CRENSHAW You will receive a reminder letter in the mail two months in advance. If you don't receive a letter, please call our office to schedule the follow-up appointment.   Your physician recommends that you HAVE LAB WORK TODAY 

## 2014-05-12 NOTE — Assessment & Plan Note (Addendum)
Patient remains in atrial fibrillation. Continue metoprolol and digitoxin for rate control. Continue Coumadin. He is not on a DOAC because of finances. Check hemoglobin.

## 2014-05-12 NOTE — Assessment & Plan Note (Signed)
Blood pressure mildly elevated. However he follows this closely at home. Typically 140/80. I have asked him to track this and we will advance medications if needed.

## 2014-06-05 ENCOUNTER — Other Ambulatory Visit: Payer: Self-pay | Admitting: Cardiology

## 2014-06-21 ENCOUNTER — Ambulatory Visit (INDEPENDENT_AMBULATORY_CARE_PROVIDER_SITE_OTHER): Payer: Federal, State, Local not specified - PPO | Admitting: *Deleted

## 2014-06-21 DIAGNOSIS — Z5181 Encounter for therapeutic drug level monitoring: Secondary | ICD-10-CM | POA: Diagnosis not present

## 2014-06-21 DIAGNOSIS — I4891 Unspecified atrial fibrillation: Secondary | ICD-10-CM

## 2014-06-21 DIAGNOSIS — Z7901 Long term (current) use of anticoagulants: Secondary | ICD-10-CM

## 2014-06-21 LAB — POCT INR: INR: 2.2

## 2014-08-03 ENCOUNTER — Ambulatory Visit (INDEPENDENT_AMBULATORY_CARE_PROVIDER_SITE_OTHER): Payer: Federal, State, Local not specified - PPO | Admitting: *Deleted

## 2014-08-03 DIAGNOSIS — Z7901 Long term (current) use of anticoagulants: Secondary | ICD-10-CM | POA: Diagnosis not present

## 2014-08-03 DIAGNOSIS — Z5181 Encounter for therapeutic drug level monitoring: Secondary | ICD-10-CM

## 2014-08-03 DIAGNOSIS — I4891 Unspecified atrial fibrillation: Secondary | ICD-10-CM

## 2014-08-03 LAB — POCT INR: INR: 2.5

## 2014-08-09 ENCOUNTER — Ambulatory Visit (INDEPENDENT_AMBULATORY_CARE_PROVIDER_SITE_OTHER): Payer: Federal, State, Local not specified - PPO | Admitting: *Deleted

## 2014-08-09 DIAGNOSIS — Z7901 Long term (current) use of anticoagulants: Secondary | ICD-10-CM

## 2014-08-09 DIAGNOSIS — Z5181 Encounter for therapeutic drug level monitoring: Secondary | ICD-10-CM

## 2014-08-09 DIAGNOSIS — I4891 Unspecified atrial fibrillation: Secondary | ICD-10-CM

## 2014-08-09 LAB — POCT INR: INR: 2.3

## 2014-09-21 ENCOUNTER — Ambulatory Visit (INDEPENDENT_AMBULATORY_CARE_PROVIDER_SITE_OTHER): Payer: Federal, State, Local not specified - PPO

## 2014-09-21 DIAGNOSIS — Z7901 Long term (current) use of anticoagulants: Secondary | ICD-10-CM

## 2014-09-21 DIAGNOSIS — I4891 Unspecified atrial fibrillation: Secondary | ICD-10-CM | POA: Diagnosis not present

## 2014-09-21 DIAGNOSIS — Z5181 Encounter for therapeutic drug level monitoring: Secondary | ICD-10-CM | POA: Diagnosis not present

## 2014-09-21 LAB — POCT INR: INR: 2.7

## 2014-10-09 ENCOUNTER — Other Ambulatory Visit: Payer: Self-pay | Admitting: Pharmacist

## 2014-10-09 MED ORDER — WARFARIN SODIUM 4 MG PO TABS: ORAL_TABLET | ORAL | Status: DC

## 2014-11-02 ENCOUNTER — Ambulatory Visit (INDEPENDENT_AMBULATORY_CARE_PROVIDER_SITE_OTHER): Payer: Federal, State, Local not specified - PPO | Admitting: *Deleted

## 2014-11-02 DIAGNOSIS — I4891 Unspecified atrial fibrillation: Secondary | ICD-10-CM

## 2014-11-02 DIAGNOSIS — Z5181 Encounter for therapeutic drug level monitoring: Secondary | ICD-10-CM

## 2014-11-02 DIAGNOSIS — Z7901 Long term (current) use of anticoagulants: Secondary | ICD-10-CM | POA: Diagnosis not present

## 2014-11-02 LAB — POCT INR: INR: 2.4

## 2014-11-30 ENCOUNTER — Ambulatory Visit
Admission: RE | Admit: 2014-11-30 | Discharge: 2014-11-30 | Disposition: A | Discharge status: Home / Self Care | Payer: Federal, State, Local not specified - PPO | Source: Ambulatory Visit | Attending: Family Medicine | Admitting: Family Medicine

## 2014-11-30 ENCOUNTER — Other Ambulatory Visit: Payer: Self-pay | Admitting: Family Medicine

## 2014-11-30 DIAGNOSIS — M25562 Pain in left knee: Secondary | ICD-10-CM

## 2014-12-13 ENCOUNTER — Ambulatory Visit (INDEPENDENT_AMBULATORY_CARE_PROVIDER_SITE_OTHER): Payer: Federal, State, Local not specified - PPO | Admitting: *Deleted

## 2014-12-13 DIAGNOSIS — Z7901 Long term (current) use of anticoagulants: Secondary | ICD-10-CM

## 2014-12-13 DIAGNOSIS — Z5181 Encounter for therapeutic drug level monitoring: Secondary | ICD-10-CM | POA: Diagnosis not present

## 2014-12-13 DIAGNOSIS — I4891 Unspecified atrial fibrillation: Secondary | ICD-10-CM

## 2014-12-13 LAB — POCT INR: INR: 3.1

## 2015-01-08 ENCOUNTER — Other Ambulatory Visit: Payer: Self-pay | Admitting: *Deleted

## 2015-01-09 ENCOUNTER — Telehealth: Payer: Self-pay | Admitting: Cardiology

## 2015-01-09 NOTE — Telephone Encounter (Signed)
Please advise 

## 2015-01-09 NOTE — Telephone Encounter (Signed)
[facility] calling requesting a refill on metoprolol succinate 100 mg tablet. Request has been sent before. Please advise

## 2015-01-09 NOTE — Telephone Encounter (Signed)
New Message   *STAT* If patient is at the pharmacy, call can be transferred to refill team.   1. Which medications need to be refilled? (please list name of each medication and dose if known) METOPROLOL- 100 mg  2. Which pharmacy/location (including street and city if local pharmacy) is medication to be sent to? [facility] [address]  3. Do they need a 30 day or 90 day supply? Did not know

## 2015-01-09 NOTE — Telephone Encounter (Signed)
Pharmacy called and given a verbal OK to refill Metoprolol ER 100 mg daily #135 w/1 refill.

## 2015-01-09 NOTE — Telephone Encounter (Signed)
Please advise on what?

## 2015-01-10 MED ORDER — METOPROLOL SUCCINATE ER 100 MG PO TB24: ORAL_TABLET | ORAL | Status: DC

## 2015-01-10 NOTE — Telephone Encounter (Signed)
Rx(s) sent to pharmacy electronically.  

## 2015-01-25 ENCOUNTER — Ambulatory Visit (INDEPENDENT_AMBULATORY_CARE_PROVIDER_SITE_OTHER): Payer: Federal, State, Local not specified - PPO | Admitting: *Deleted

## 2015-01-25 DIAGNOSIS — I4891 Unspecified atrial fibrillation: Secondary | ICD-10-CM | POA: Diagnosis not present

## 2015-01-25 DIAGNOSIS — Z5181 Encounter for therapeutic drug level monitoring: Secondary | ICD-10-CM | POA: Diagnosis not present

## 2015-01-25 DIAGNOSIS — Z7901 Long term (current) use of anticoagulants: Secondary | ICD-10-CM

## 2015-01-25 LAB — POCT INR: INR: 2.4

## 2015-02-28 ENCOUNTER — Other Ambulatory Visit: Payer: Self-pay | Admitting: Family Medicine

## 2015-02-28 DIAGNOSIS — M25562 Pain in left knee: Secondary | ICD-10-CM

## 2015-03-01 ENCOUNTER — Telehealth: Payer: Self-pay | Admitting: *Deleted

## 2015-03-01 ENCOUNTER — Encounter: Payer: Self-pay | Admitting: *Deleted

## 2015-03-01 NOTE — Telephone Encounter (Signed)
Will fax this note to the number provided. 

## 2015-03-01 NOTE — Telephone Encounter (Signed)
Ok to hold coumadin 4 days prior to procedure and resume day of Ronnie Sullivan

## 2015-03-01 NOTE — Telephone Encounter (Signed)
Pt is needing left knee steroid injection, they are requesting permission to stop coumadin 4 days prior to the procedure. Will forward for dr Stanford Breed review

## 2015-03-06 ENCOUNTER — Ambulatory Visit (INDEPENDENT_AMBULATORY_CARE_PROVIDER_SITE_OTHER): Payer: Federal, State, Local not specified - PPO | Admitting: *Deleted

## 2015-03-06 DIAGNOSIS — Z7901 Long term (current) use of anticoagulants: Secondary | ICD-10-CM | POA: Diagnosis not present

## 2015-03-06 DIAGNOSIS — Z5181 Encounter for therapeutic drug level monitoring: Secondary | ICD-10-CM | POA: Diagnosis not present

## 2015-03-06 DIAGNOSIS — I4891 Unspecified atrial fibrillation: Secondary | ICD-10-CM

## 2015-03-06 LAB — POCT INR: INR: 1.3

## 2015-03-07 ENCOUNTER — Ambulatory Visit
Admission: RE | Admit: 2015-03-07 | Discharge: 2015-03-07 | Disposition: A | Discharge status: Home / Self Care | Payer: Federal, State, Local not specified - PPO | Source: Ambulatory Visit | Attending: Family Medicine | Admitting: Family Medicine

## 2015-03-07 DIAGNOSIS — M25562 Pain in left knee: Secondary | ICD-10-CM

## 2015-03-07 MED ORDER — METHYLPREDNISOLONE ACETATE 40 MG/ML INJ SUSP (RADIOLOG
120.0000 mg | Freq: Once | INTRAMUSCULAR | Status: AC
  Administered 2015-03-07: 120 mg via INTRA_ARTICULAR

## 2015-03-07 MED ORDER — IOHEXOL 180 MG/ML  SOLN
1.0000 mL | Freq: Once | INTRAMUSCULAR | Status: AC | PRN
  Administered 2015-03-07: 1 mL via INTRA_ARTICULAR

## 2015-03-14 ENCOUNTER — Other Ambulatory Visit: Payer: Self-pay | Admitting: Pharmacist Clinician (PhC)/ Clinical Pharmacy Specialist

## 2015-03-14 MED ORDER — WARFARIN SODIUM 4 MG PO TABS: ORAL_TABLET | ORAL | Status: DC

## 2015-03-16 ENCOUNTER — Ambulatory Visit (INDEPENDENT_AMBULATORY_CARE_PROVIDER_SITE_OTHER): Payer: Federal, State, Local not specified - PPO | Admitting: Pharmacist

## 2015-03-16 DIAGNOSIS — Z5181 Encounter for therapeutic drug level monitoring: Secondary | ICD-10-CM | POA: Diagnosis not present

## 2015-03-16 DIAGNOSIS — Z7901 Long term (current) use of anticoagulants: Secondary | ICD-10-CM

## 2015-03-16 DIAGNOSIS — I4891 Unspecified atrial fibrillation: Secondary | ICD-10-CM | POA: Diagnosis not present

## 2015-03-16 LAB — POCT INR: INR: 2.2

## 2015-04-05 ENCOUNTER — Ambulatory Visit (INDEPENDENT_AMBULATORY_CARE_PROVIDER_SITE_OTHER): Payer: Federal, State, Local not specified - PPO | Admitting: Pharmacist

## 2015-04-05 DIAGNOSIS — Z7901 Long term (current) use of anticoagulants: Secondary | ICD-10-CM

## 2015-04-05 DIAGNOSIS — Z5181 Encounter for therapeutic drug level monitoring: Secondary | ICD-10-CM | POA: Diagnosis not present

## 2015-04-05 DIAGNOSIS — I4891 Unspecified atrial fibrillation: Secondary | ICD-10-CM | POA: Diagnosis not present

## 2015-04-05 LAB — POCT INR: INR: 2.3

## 2015-05-03 ENCOUNTER — Ambulatory Visit (INDEPENDENT_AMBULATORY_CARE_PROVIDER_SITE_OTHER): Payer: Federal, State, Local not specified - PPO | Admitting: *Deleted

## 2015-05-03 DIAGNOSIS — I4891 Unspecified atrial fibrillation: Secondary | ICD-10-CM | POA: Diagnosis not present

## 2015-05-03 DIAGNOSIS — Z7901 Long term (current) use of anticoagulants: Secondary | ICD-10-CM

## 2015-05-03 DIAGNOSIS — Z5181 Encounter for therapeutic drug level monitoring: Secondary | ICD-10-CM | POA: Diagnosis not present

## 2015-05-03 LAB — POCT INR: INR: 2.4

## 2015-05-30 ENCOUNTER — Other Ambulatory Visit: Payer: Self-pay | Admitting: Cardiology

## 2015-05-30 NOTE — Telephone Encounter (Signed)
Rx has been sent to the pharmacy electronically. ° °

## 2015-06-14 ENCOUNTER — Ambulatory Visit (INDEPENDENT_AMBULATORY_CARE_PROVIDER_SITE_OTHER): Payer: Federal, State, Local not specified - PPO | Admitting: Pharmacist

## 2015-06-14 DIAGNOSIS — Z5181 Encounter for therapeutic drug level monitoring: Secondary | ICD-10-CM

## 2015-06-14 DIAGNOSIS — I4891 Unspecified atrial fibrillation: Secondary | ICD-10-CM | POA: Diagnosis not present

## 2015-06-14 DIAGNOSIS — Z7901 Long term (current) use of anticoagulants: Secondary | ICD-10-CM | POA: Diagnosis not present

## 2015-06-14 LAB — POCT INR: INR: 2.1

## 2015-07-05 ENCOUNTER — Telehealth: Payer: Self-pay | Admitting: Cardiology

## 2015-07-05 MED ORDER — DIGOXIN 125 MCG PO TABS: 0.1250 mg | ORAL_TABLET | Freq: Every day | ORAL | Status: DC

## 2015-07-05 NOTE — Telephone Encounter (Signed)
rx refilled, pt notified and voiced thanks.

## 2015-07-05 NOTE — Telephone Encounter (Signed)
New message      *STAT* If patient is at the pharmacy, call can be transferred to refill team.   1. Which medications need to be refilled? (please list name of each medication and dose if known) digoxin 0.125mg  2. Which pharmacy/location (including street and city if local pharmacy) is medication to be sent to? [email or web address] 3. Do they need a 30 day or 90 day supply?30 day                   Pt is out of medication

## 2015-07-19 ENCOUNTER — Encounter: Payer: Self-pay | Admitting: *Deleted

## 2015-07-26 ENCOUNTER — Ambulatory Visit (INDEPENDENT_AMBULATORY_CARE_PROVIDER_SITE_OTHER): Payer: Federal, State, Local not specified - PPO | Admitting: *Deleted

## 2015-07-26 DIAGNOSIS — Z5181 Encounter for therapeutic drug level monitoring: Secondary | ICD-10-CM | POA: Diagnosis not present

## 2015-07-26 DIAGNOSIS — Z7901 Long term (current) use of anticoagulants: Secondary | ICD-10-CM | POA: Diagnosis not present

## 2015-07-26 DIAGNOSIS — I4891 Unspecified atrial fibrillation: Secondary | ICD-10-CM

## 2015-07-26 LAB — POCT INR: INR: 2.3

## 2015-07-31 NOTE — Progress Notes (Signed)
HPI: FU hypertension, hyperlipidemia, and atrial fibrillation. Myoview performed on December 23, 2007 showed an ejection fraction 64% with no ischemia or infarction. Last echocardiogram was performed in November 2013 and revealed normal LV function, moderate biatrial enlargement, mild right ventricular enlargement, mild mitral regurgitation and trace aortic insufficiency. Previous TSH was normal. He has had a previous attempt at outpatient cardioversion. He transiently converted to sinus rhythm, but maintained sinus rhythm only for seconds. We have therefore elected to treat him with rate control and anticoagulation. A previous Holter monitor in February of 2010 showed that his rate was adequately controlled. Since I last saw him, He denies dyspnea, chest pain, palpitations, syncope or bleeding. Chronic mild pedal edema.  Current Outpatient Prescriptions  Medication Sig Dispense Refill  . allopurinol (ZYLOPRIM) 300 MG tablet Take 300 mg by mouth daily.    Marland Kitchen amLODipine (NORVASC) 10 MG tablet Take 1 tablet (10 mg total) by mouth daily. 30 tablet 12  . atorvastatin (LIPITOR) 20 MG tablet Take 10 mg by mouth daily.     . digoxin (LANOXIN) 0.125 MG tablet Take 1 tablet (0.125 mg total) by mouth daily. Keep appointment for further refills. 30 tablet 0  . fish oil-omega-3 fatty acids 1000 MG capsule Take 1,200 mg by mouth daily.      Marland Kitchen latanoprost (XALATAN) 0.005 % ophthalmic solution Place 1 drop into both eyes at bedtime.      Marland Kitchen losartan (COZAAR) 100 MG tablet Take 1 tablet (100 mg total) by mouth daily. 90 tablet 4  . metoprolol succinate (TOPROL-XL) 100 MG 24 hr tablet TAKE ONE AND ONE-HALF TABLETS BY MOUTH DAILY 135 tablet 2  . Multiple Vitamin (MULTIVITAMIN) tablet Take 1 tablet by mouth daily.      Marland Kitchen omeprazole (PRILOSEC) 20 MG capsule Take 20 mg by mouth daily.    Marland Kitchen torsemide (DEMADEX) 20 MG tablet Take 1 tablet (20 mg total) by mouth daily. 30 tablet 12  . vitamin C (ASCORBIC ACID) 500 MG  tablet Take 500 mg by mouth daily.      Marland Kitchen warfarin (COUMADIN) 4 MG tablet Take 1.5 to 2 tablets by mouth daily as directed by coumadin clinic 60 tablet 3   No current facility-administered medications for this visit.     Past Medical History  Diagnosis Date  . Atrial fibrillation ([place])   . Tricuspid regurgitation   . Mitral regurgitation   . Aortic insufficiency   . Hyperlipidemia   . HTN (hypertension)   . Prostate cancer ([place])     XRT  . Diverticulosis   . External hemorrhoids   . Tubular adenoma of colon 11/2004  . CKD (chronic kidney disease)   . Hearing loss of both ears 2012  . Macular degeneration 2012    right  . Left lumbar radiculopathy 2013  . Glucose intolerance (impaired glucose tolerance)   . Glaucoma 2010    Past Surgical History  Procedure Laterality Date  . Appendectomy    . Colonoscopy  2006  . Cataract extraction  2008  . Elbow fracture surgery  1995    Social History   Social History  . Marital Status: Married    Spouse Name: N/A  . Number of Children: N/A  . Years of Education: N/A   Occupational History  . retired    Social History Main Topics  . Smoking status: Never Smoker   . Smokeless tobacco: Never Used  . Alcohol Use: 3.0 oz/week    5 Glasses of  wine per week     Comment: every afternoon  . Drug Use: No  . Sexual Activity: Not on file   Other Topics Concern  . Not on file   Social History Narrative    Family History  Problem Relation Age of Onset  . Kidney failure Mother     ROS: no fevers or chills, productive cough, hemoptysis, dysphasia, odynophagia, melena, hematochezia, dysuria, hematuria, rash, seizure activity, orthopnea, PND, pedal edema, claudication. Remaining systems are negative.  Physical Exam: Well-developed well-nourished in no acute distress.  Skin is warm and dry.  HEENT is normal.  Neck is supple.  Chest is clear to auscultation with normal expansion.  Cardiovascular exam is irregular Abdominal  exam nontender or distended. No masses palpated. Extremities show 1+ ankle edema. neuro grossly intact  ECG Atrial fibrillation at a rate of 68. Nonspecific ST changes.Poor R-wave progression.  A/P  1 Atrial fibrillation-patient remains in permanent atrial fibrillation. Continue metoprolol and digoxin for rate control. Continue Coumadin. Check hemoglobin. Not on DOAC due to expense.  2 edema-continue present dose of diuretics. Check potassium and renal function.  3 hyperlipidemia-continue statin.  4 hypertension-blood pressure is reasonably well controlled for his age. Continue present medications and follow. Check potassium and renal function.  Kirk Ruths, MD

## 2015-08-02 ENCOUNTER — Ambulatory Visit (INDEPENDENT_AMBULATORY_CARE_PROVIDER_SITE_OTHER): Payer: Federal, State, Local not specified - PPO | Admitting: Cardiology

## 2015-08-02 ENCOUNTER — Encounter: Payer: Self-pay | Admitting: Cardiology

## 2015-08-02 VITALS — BP 148/74 | HR 68 | Ht 70.0 in | Wt 223.0 lb

## 2015-08-02 DIAGNOSIS — I4891 Unspecified atrial fibrillation: Secondary | ICD-10-CM | POA: Diagnosis not present

## 2015-08-02 DIAGNOSIS — E785 Hyperlipidemia, unspecified: Secondary | ICD-10-CM

## 2015-08-02 DIAGNOSIS — I482 Chronic atrial fibrillation, unspecified: Secondary | ICD-10-CM

## 2015-08-02 DIAGNOSIS — R609 Edema, unspecified: Secondary | ICD-10-CM

## 2015-08-02 DIAGNOSIS — I10 Essential (primary) hypertension: Secondary | ICD-10-CM

## 2015-08-02 LAB — BASIC METABOLIC PANEL
BUN: 26 mg/dL — AB (ref 7–25)
CHLORIDE: 103 mmol/L (ref 98–110)
CO2: 28 mmol/L (ref 20–31)
CREATININE: 1.81 mg/dL — AB (ref 0.70–1.11)
Calcium: 10 mg/dL (ref 8.6–10.3)
Glucose, Bld: 98 mg/dL (ref 65–99)
Potassium: 4.8 mmol/L (ref 3.5–5.3)
Sodium: 142 mmol/L (ref 135–146)

## 2015-08-02 LAB — CBC
HEMATOCRIT: 45.7 % (ref 38.5–50.0)
Hemoglobin: 15.2 g/dL (ref 13.2–17.1)
MCH: 32.8 pg (ref 27.0–33.0)
MCHC: 33.3 g/dL (ref 32.0–36.0)
MCV: 98.5 fL (ref 80.0–100.0)
MPV: 10.9 fL (ref 7.5–12.5)
Platelets: 193 10*3/uL (ref 140–400)
RBC: 4.64 MIL/uL (ref 4.20–5.80)
RDW: 16.1 % — AB (ref 11.0–15.0)
WBC: 7.6 10*3/uL (ref 3.8–10.8)

## 2015-08-02 NOTE — Patient Instructions (Signed)
Medication Instructions:   NO CHANGE  Labwork:  Your physician recommends that you HAVE LAB WORK TODAY  Follow-Up:  Your physician wants you to follow-up in: ONE YEAR WITH DR CRENSHAW You will receive a reminder letter in the mail two months in advance. If you don't receive a letter, please call our office to schedule the follow-up appointment.   If you need a refill on your cardiac medications before your next appointment, please call your pharmacy.    

## 2015-08-03 ENCOUNTER — Other Ambulatory Visit: Payer: Self-pay

## 2015-08-13 ENCOUNTER — Other Ambulatory Visit: Payer: Self-pay | Admitting: Cardiology

## 2015-09-03 ENCOUNTER — Telehealth: Payer: Self-pay | Admitting: Cardiology

## 2015-09-03 NOTE — Telephone Encounter (Signed)
Spoke with pt, his bp is consistently below 140/60 and his pulse is running from 50-70 bpm. Advised pt he does not need to follow bp or pulse unless he is having problems or concerns. Pt agreed with this plan.

## 2015-09-03 NOTE — Telephone Encounter (Signed)
New message      Calling to see how long pt is to keep a record of his bp and pulse and why is he required to do that?  Please call

## 2015-09-06 ENCOUNTER — Ambulatory Visit (INDEPENDENT_AMBULATORY_CARE_PROVIDER_SITE_OTHER): Payer: Federal, State, Local not specified - PPO

## 2015-09-06 DIAGNOSIS — Z5181 Encounter for therapeutic drug level monitoring: Secondary | ICD-10-CM

## 2015-09-06 DIAGNOSIS — Z7901 Long term (current) use of anticoagulants: Secondary | ICD-10-CM | POA: Diagnosis not present

## 2015-09-06 DIAGNOSIS — I4891 Unspecified atrial fibrillation: Secondary | ICD-10-CM | POA: Diagnosis not present

## 2015-09-06 LAB — POCT INR: INR: 2.1

## 2015-10-18 ENCOUNTER — Ambulatory Visit (INDEPENDENT_AMBULATORY_CARE_PROVIDER_SITE_OTHER): Payer: Federal, State, Local not specified - PPO | Admitting: Pharmacist

## 2015-10-18 DIAGNOSIS — I4891 Unspecified atrial fibrillation: Secondary | ICD-10-CM | POA: Diagnosis not present

## 2015-10-18 DIAGNOSIS — Z7901 Long term (current) use of anticoagulants: Secondary | ICD-10-CM | POA: Diagnosis not present

## 2015-10-18 DIAGNOSIS — Z5181 Encounter for therapeutic drug level monitoring: Secondary | ICD-10-CM

## 2015-10-18 LAB — POCT INR: INR: 1.7

## 2015-11-15 ENCOUNTER — Ambulatory Visit (INDEPENDENT_AMBULATORY_CARE_PROVIDER_SITE_OTHER): Payer: Federal, State, Local not specified - PPO | Admitting: *Deleted

## 2015-11-15 DIAGNOSIS — Z5181 Encounter for therapeutic drug level monitoring: Secondary | ICD-10-CM

## 2015-11-15 DIAGNOSIS — Z7901 Long term (current) use of anticoagulants: Secondary | ICD-10-CM | POA: Diagnosis not present

## 2015-11-15 DIAGNOSIS — I4891 Unspecified atrial fibrillation: Secondary | ICD-10-CM

## 2015-11-15 LAB — POCT INR: INR: 2.3

## 2015-12-27 ENCOUNTER — Ambulatory Visit (INDEPENDENT_AMBULATORY_CARE_PROVIDER_SITE_OTHER): Payer: Federal, State, Local not specified - PPO | Admitting: *Deleted

## 2015-12-27 DIAGNOSIS — Z5181 Encounter for therapeutic drug level monitoring: Secondary | ICD-10-CM | POA: Diagnosis not present

## 2015-12-27 DIAGNOSIS — Z7901 Long term (current) use of anticoagulants: Secondary | ICD-10-CM

## 2015-12-27 DIAGNOSIS — I4891 Unspecified atrial fibrillation: Secondary | ICD-10-CM

## 2015-12-27 LAB — POCT INR: INR: 1.9

## 2016-01-08 ENCOUNTER — Other Ambulatory Visit: Payer: Self-pay | Admitting: *Deleted

## 2016-01-08 ENCOUNTER — Other Ambulatory Visit: Payer: Self-pay | Admitting: Cardiology

## 2016-01-08 MED ORDER — WARFARIN SODIUM 4 MG PO TABS: ORAL_TABLET | ORAL | 3 refills | Status: DC

## 2016-02-11 ENCOUNTER — Ambulatory Visit (INDEPENDENT_AMBULATORY_CARE_PROVIDER_SITE_OTHER): Payer: Federal, State, Local not specified - PPO

## 2016-02-11 DIAGNOSIS — Z7901 Long term (current) use of anticoagulants: Secondary | ICD-10-CM | POA: Diagnosis not present

## 2016-02-11 DIAGNOSIS — Z5181 Encounter for therapeutic drug level monitoring: Secondary | ICD-10-CM | POA: Diagnosis not present

## 2016-02-11 DIAGNOSIS — I4891 Unspecified atrial fibrillation: Secondary | ICD-10-CM

## 2016-02-11 LAB — POCT INR: INR: 2.8

## 2016-03-27 ENCOUNTER — Ambulatory Visit (INDEPENDENT_AMBULATORY_CARE_PROVIDER_SITE_OTHER): Payer: Federal, State, Local not specified - PPO | Admitting: Pharmacist

## 2016-03-27 DIAGNOSIS — Z7901 Long term (current) use of anticoagulants: Secondary | ICD-10-CM | POA: Diagnosis not present

## 2016-03-27 DIAGNOSIS — I4891 Unspecified atrial fibrillation: Secondary | ICD-10-CM

## 2016-03-27 DIAGNOSIS — Z5181 Encounter for therapeutic drug level monitoring: Secondary | ICD-10-CM

## 2016-03-27 LAB — POCT INR: INR: 2.4

## 2016-03-31 ENCOUNTER — Other Ambulatory Visit: Payer: Self-pay | Admitting: Cardiology

## 2016-05-08 ENCOUNTER — Ambulatory Visit (INDEPENDENT_AMBULATORY_CARE_PROVIDER_SITE_OTHER): Payer: Federal, State, Local not specified - PPO | Admitting: *Deleted

## 2016-05-08 DIAGNOSIS — Z7901 Long term (current) use of anticoagulants: Secondary | ICD-10-CM | POA: Diagnosis not present

## 2016-05-08 DIAGNOSIS — I4891 Unspecified atrial fibrillation: Secondary | ICD-10-CM

## 2016-05-08 DIAGNOSIS — Z5181 Encounter for therapeutic drug level monitoring: Secondary | ICD-10-CM | POA: Diagnosis not present

## 2016-05-08 LAB — POCT INR: INR: 2.9

## 2016-06-11 ENCOUNTER — Other Ambulatory Visit: Payer: Self-pay | Admitting: Cardiology

## 2016-06-19 ENCOUNTER — Ambulatory Visit (INDEPENDENT_AMBULATORY_CARE_PROVIDER_SITE_OTHER): Payer: Federal, State, Local not specified - PPO

## 2016-06-19 ENCOUNTER — Encounter (INDEPENDENT_AMBULATORY_CARE_PROVIDER_SITE_OTHER): Payer: Self-pay

## 2016-06-19 DIAGNOSIS — Z7901 Long term (current) use of anticoagulants: Secondary | ICD-10-CM

## 2016-06-19 DIAGNOSIS — I4891 Unspecified atrial fibrillation: Secondary | ICD-10-CM | POA: Diagnosis not present

## 2016-06-19 DIAGNOSIS — Z5181 Encounter for therapeutic drug level monitoring: Secondary | ICD-10-CM

## 2016-06-19 LAB — POCT INR: INR: 2.4

## 2016-07-31 ENCOUNTER — Ambulatory Visit (INDEPENDENT_AMBULATORY_CARE_PROVIDER_SITE_OTHER): Payer: Federal, State, Local not specified - PPO | Admitting: *Deleted

## 2016-07-31 DIAGNOSIS — Z5181 Encounter for therapeutic drug level monitoring: Secondary | ICD-10-CM

## 2016-07-31 DIAGNOSIS — I4891 Unspecified atrial fibrillation: Secondary | ICD-10-CM | POA: Diagnosis not present

## 2016-07-31 DIAGNOSIS — Z7901 Long term (current) use of anticoagulants: Secondary | ICD-10-CM

## 2016-07-31 LAB — POCT INR: INR: 3

## 2016-08-22 NOTE — Progress Notes (Signed)
HPI: FU hypertension, hyperlipidemia, and atrial fibrillation. Myoview performed on December 23, 2007 showed an ejection fraction 64% with no ischemia or infarction. Last echocardiogram was performed in November 2013 and revealed normal LV function, moderate biatrial enlargement, mild right ventricular enlargement, mild mitral regurgitation and trace aortic insufficiency. Previous TSH was normal. He has had a previous attempt at outpatient cardioversion. He transiently converted to sinus rhythm, but maintained sinus rhythm only for seconds. We have therefore elected to treat him with rate control and anticoagulation. A previous Holter monitor in February of 2010 showed that his rate was adequately controlled. Since I last saw him, patient denies dyspnea, chest pain, palpitations, syncope or bleeding. He continues to have pedal edema.  Current Outpatient Prescriptions  Medication Sig Dispense Refill  . allopurinol (ZYLOPRIM) 300 MG tablet Take 300 mg by mouth daily.    Marland Kitchen amLODipine (NORVASC) 10 MG tablet Take 1 tablet (10 mg total) by mouth daily. 30 tablet 12  . latanoprost (XALATAN) 0.005 % ophthalmic solution Place 1 drop into both eyes at bedtime.      Marland Kitchen losartan (COZAAR) 100 MG tablet Take 1 tablet (100 mg total) by mouth daily. 90 tablet 4  . metoprolol succinate (TOPROL-XL) 100 MG 24 hr tablet TAKE 1 & 1/2 TABLETS BY MOUTH EVERY DAY 135 tablet 2  . Multiple Vitamin (MULTIVITAMIN) tablet Take 1 tablet by mouth daily.      Marland Kitchen torsemide (DEMADEX) 20 MG tablet Take 1 tablet (20 mg total) by mouth daily. 30 tablet 12  . warfarin (COUMADIN) 4 MG tablet TAKE 1.5 TO 2 TABLETS BY MOUTH DAILY AS DIRECTED BY COUMADIN CLINIC 60 tablet 3   No current facility-administered medications for this visit.      Past Medical History:  Diagnosis Date  . Aortic insufficiency   . Atrial fibrillation ([place])   . CKD (chronic kidney disease)   . Diverticulosis   . External hemorrhoids   . Glaucoma 2010  .  Glucose intolerance (impaired glucose tolerance)   . Hearing loss of both ears 2012  . HTN (hypertension)   . Hyperlipidemia   . Left lumbar radiculopathy 2013  . Macular degeneration 2012   right  . Mitral regurgitation   . Prostate cancer ([place])    XRT  . Tricuspid regurgitation   . Tubular adenoma of colon 11/2004    Past Surgical History:  Procedure Laterality Date  . APPENDECTOMY    . CATARACT EXTRACTION  2008  . COLONOSCOPY  2006  . [place]    Social History   Social History  . Marital status: Married    Spouse name: N/A  . Number of children: N/A  . Years of education: N/A   Occupational History  . retired    Social History Main Topics  . Smoking status: Never Smoker  . Smokeless tobacco: Never Used  . Alcohol use 3.0 oz/week    5 Glasses of wine per week     Comment: every afternoon  . Drug use: No  . Sexual activity: Not on file   Other Topics Concern  . Not on file   Social History Narrative  . No narrative on file    Family History  Problem Relation Age of Onset  . Kidney failure Mother     ROS: no fevers or chills, productive cough, hemoptysis, dysphasia, odynophagia, melena, hematochezia, dysuria, hematuria, rash, seizure activity, orthopnea, PND, claudication. Remaining systems are negative.  Physical Exam: Well-developed well-nourished  in no acute distress.  Skin is warm and dry.  HEENT is normal.  Neck is supple.  Chest is clear to auscultation with normal expansion.  Cardiovascular exam is irregular Abdominal exam nontender or distended. No masses palpated. Extremities show 1-2+ edema. neuro grossly intact  ECG- Atrial fibrillation, PVCs or aberrantly conducted beats, no ST changes. personally reviewed  A/P  1 Permanent atrial fibrillation-plan to continue metoprolol for rate control. Continue Coumadin. Patient does not want to consider NOAC due to expense.  2 hypertension-blood pressure controlled. Continue  present medications.   3 hyperlipidemia-continue statin.   4 lower extremity edema-continue present dose of diuretics. We discussed compressionand keeping his feet elevated at night.  Kirk Ruths, MD

## 2016-09-02 ENCOUNTER — Encounter: Payer: Self-pay | Admitting: Cardiology

## 2016-09-02 ENCOUNTER — Ambulatory Visit (INDEPENDENT_AMBULATORY_CARE_PROVIDER_SITE_OTHER): Payer: Federal, State, Local not specified - PPO | Admitting: Cardiology

## 2016-09-02 VITALS — BP 138/76 | HR 84 | Ht 70.0 in | Wt 237.0 lb

## 2016-09-02 DIAGNOSIS — E7800 Pure hypercholesterolemia, unspecified: Secondary | ICD-10-CM | POA: Diagnosis not present

## 2016-09-02 DIAGNOSIS — I482 Chronic atrial fibrillation: Secondary | ICD-10-CM | POA: Diagnosis not present

## 2016-09-02 DIAGNOSIS — R609 Edema, unspecified: Secondary | ICD-10-CM

## 2016-09-02 DIAGNOSIS — I4821 Permanent atrial fibrillation: Secondary | ICD-10-CM

## 2016-09-02 DIAGNOSIS — I10 Essential (primary) hypertension: Secondary | ICD-10-CM | POA: Diagnosis not present

## 2016-09-02 NOTE — Patient Instructions (Signed)
Your physician wants you to follow-up in: ONE YEAR WITH DR CRENSHAW You will receive a reminder letter in the mail two months in advance. If you don't receive a letter, please call our office to schedule the follow-up appointment.   If you need a refill on your cardiac medications before your next appointment, please call your pharmacy.  

## 2016-09-08 ENCOUNTER — Other Ambulatory Visit: Payer: Self-pay | Admitting: Family Medicine

## 2016-09-08 ENCOUNTER — Ambulatory Visit
Admission: RE | Admit: 2016-09-08 | Discharge: 2016-09-08 | Disposition: A | Discharge status: Home / Self Care | Payer: Federal, State, Local not specified - PPO | Source: Ambulatory Visit | Attending: Family Medicine | Admitting: Family Medicine

## 2016-09-08 DIAGNOSIS — R609 Edema, unspecified: Secondary | ICD-10-CM

## 2016-09-11 ENCOUNTER — Ambulatory Visit (INDEPENDENT_AMBULATORY_CARE_PROVIDER_SITE_OTHER): Payer: Federal, State, Local not specified - PPO

## 2016-09-11 DIAGNOSIS — I4891 Unspecified atrial fibrillation: Secondary | ICD-10-CM | POA: Diagnosis not present

## 2016-09-11 DIAGNOSIS — Z5181 Encounter for therapeutic drug level monitoring: Secondary | ICD-10-CM | POA: Diagnosis not present

## 2016-09-11 DIAGNOSIS — Z7901 Long term (current) use of anticoagulants: Secondary | ICD-10-CM

## 2016-09-11 LAB — POCT INR: INR: 3.1

## 2016-10-16 ENCOUNTER — Ambulatory Visit (INDEPENDENT_AMBULATORY_CARE_PROVIDER_SITE_OTHER): Payer: Federal, State, Local not specified - PPO | Admitting: *Deleted

## 2016-10-16 DIAGNOSIS — Z7901 Long term (current) use of anticoagulants: Secondary | ICD-10-CM

## 2016-10-16 DIAGNOSIS — I080 Rheumatic disorders of both mitral and aortic valves: Secondary | ICD-10-CM | POA: Diagnosis not present

## 2016-10-16 DIAGNOSIS — I4891 Unspecified atrial fibrillation: Secondary | ICD-10-CM | POA: Diagnosis not present

## 2016-10-16 DIAGNOSIS — Z5181 Encounter for therapeutic drug level monitoring: Secondary | ICD-10-CM

## 2016-10-16 LAB — POCT INR: INR: 3.2

## 2016-10-24 ENCOUNTER — Other Ambulatory Visit: Payer: Self-pay | Admitting: Cardiology

## 2016-10-30 ENCOUNTER — Ambulatory Visit (INDEPENDENT_AMBULATORY_CARE_PROVIDER_SITE_OTHER): Payer: Federal, State, Local not specified - PPO

## 2016-10-30 DIAGNOSIS — Z5181 Encounter for therapeutic drug level monitoring: Secondary | ICD-10-CM

## 2016-10-30 DIAGNOSIS — I4891 Unspecified atrial fibrillation: Secondary | ICD-10-CM

## 2016-10-30 DIAGNOSIS — Z7901 Long term (current) use of anticoagulants: Secondary | ICD-10-CM

## 2016-10-30 LAB — POCT INR: INR: 2.1

## 2016-11-20 ENCOUNTER — Ambulatory Visit (INDEPENDENT_AMBULATORY_CARE_PROVIDER_SITE_OTHER): Payer: Federal, State, Local not specified - PPO | Admitting: *Deleted

## 2016-11-20 DIAGNOSIS — Z5181 Encounter for therapeutic drug level monitoring: Secondary | ICD-10-CM | POA: Diagnosis not present

## 2016-11-20 DIAGNOSIS — Z7901 Long term (current) use of anticoagulants: Secondary | ICD-10-CM | POA: Diagnosis not present

## 2016-11-20 DIAGNOSIS — I4891 Unspecified atrial fibrillation: Secondary | ICD-10-CM

## 2016-11-20 LAB — POCT INR: INR: 2.2

## 2016-12-18 ENCOUNTER — Ambulatory Visit (INDEPENDENT_AMBULATORY_CARE_PROVIDER_SITE_OTHER): Payer: Federal, State, Local not specified - PPO | Admitting: *Deleted

## 2016-12-18 DIAGNOSIS — Z5181 Encounter for therapeutic drug level monitoring: Secondary | ICD-10-CM

## 2016-12-18 DIAGNOSIS — Z7901 Long term (current) use of anticoagulants: Secondary | ICD-10-CM | POA: Diagnosis not present

## 2016-12-18 DIAGNOSIS — I4891 Unspecified atrial fibrillation: Secondary | ICD-10-CM

## 2016-12-18 LAB — POCT INR: INR: 2.3

## 2016-12-18 NOTE — Patient Instructions (Signed)
Continue on same dosage 1.5 tablets daily.  Recheck in 5 weeks. [facility] [phone]

## 2016-12-23 ENCOUNTER — Other Ambulatory Visit: Payer: Self-pay | Admitting: Cardiology

## 2017-01-22 ENCOUNTER — Ambulatory Visit (INDEPENDENT_AMBULATORY_CARE_PROVIDER_SITE_OTHER): Payer: Federal, State, Local not specified - PPO

## 2017-01-22 DIAGNOSIS — Z7901 Long term (current) use of anticoagulants: Secondary | ICD-10-CM | POA: Diagnosis not present

## 2017-01-22 DIAGNOSIS — Z5181 Encounter for therapeutic drug level monitoring: Secondary | ICD-10-CM | POA: Diagnosis not present

## 2017-01-22 DIAGNOSIS — I4891 Unspecified atrial fibrillation: Secondary | ICD-10-CM

## 2017-01-22 LAB — POCT INR: INR: 1.9

## 2017-01-22 NOTE — Patient Instructions (Addendum)
Take 2 tablets today, then resume same dosage 1.5 tablets daily.  Recheck in 4 weeks. [facility] Clinic [phone]

## 2017-02-19 ENCOUNTER — Ambulatory Visit (INDEPENDENT_AMBULATORY_CARE_PROVIDER_SITE_OTHER): Payer: Federal, State, Local not specified - PPO | Admitting: *Deleted

## 2017-02-19 DIAGNOSIS — Z5181 Encounter for therapeutic drug level monitoring: Secondary | ICD-10-CM | POA: Diagnosis not present

## 2017-02-19 DIAGNOSIS — I4891 Unspecified atrial fibrillation: Secondary | ICD-10-CM

## 2017-02-19 DIAGNOSIS — Z7901 Long term (current) use of anticoagulants: Secondary | ICD-10-CM | POA: Diagnosis not present

## 2017-02-19 LAB — POCT INR: INR: 2.2

## 2017-02-19 NOTE — Patient Instructions (Addendum)
Description   Continue taking Coumadin 1.5 tablets daily.  Recheck in 5 weeks. [facility] Clinic [phone]

## 2017-03-19 ENCOUNTER — Other Ambulatory Visit: Payer: Self-pay | Admitting: Cardiology

## 2017-03-26 ENCOUNTER — Ambulatory Visit (INDEPENDENT_AMBULATORY_CARE_PROVIDER_SITE_OTHER): Payer: Federal, State, Local not specified - PPO | Admitting: *Deleted

## 2017-03-26 DIAGNOSIS — Z5181 Encounter for therapeutic drug level monitoring: Secondary | ICD-10-CM

## 2017-03-26 DIAGNOSIS — Z7901 Long term (current) use of anticoagulants: Secondary | ICD-10-CM | POA: Diagnosis not present

## 2017-03-26 DIAGNOSIS — I4891 Unspecified atrial fibrillation: Secondary | ICD-10-CM | POA: Diagnosis not present

## 2017-03-26 LAB — POCT INR: INR: 2.5

## 2017-03-26 NOTE — Patient Instructions (Signed)
Description   Continue taking Coumadin 1.5 tablets daily.  Recheck in 6 weeks. [facility]madin Clinic [phone]     

## 2017-05-05 ENCOUNTER — Other Ambulatory Visit: Payer: Self-pay | Admitting: Cardiology

## 2017-05-07 ENCOUNTER — Ambulatory Visit (INDEPENDENT_AMBULATORY_CARE_PROVIDER_SITE_OTHER): Payer: Federal, State, Local not specified - PPO | Admitting: *Deleted

## 2017-05-07 DIAGNOSIS — I4891 Unspecified atrial fibrillation: Secondary | ICD-10-CM

## 2017-05-07 DIAGNOSIS — Z5181 Encounter for therapeutic drug level monitoring: Secondary | ICD-10-CM

## 2017-05-07 DIAGNOSIS — Z7901 Long term (current) use of anticoagulants: Secondary | ICD-10-CM

## 2017-05-07 LAB — POCT INR: INR: 3

## 2017-05-07 NOTE — Patient Instructions (Signed)
Description   Continue taking Coumadin 1.5 tablets daily.  Recheck in 6 weeks. [facility]madin Clinic [phone]     

## 2017-05-27 ENCOUNTER — Other Ambulatory Visit: Payer: Self-pay | Admitting: Cardiology

## 2017-06-18 ENCOUNTER — Ambulatory Visit (INDEPENDENT_AMBULATORY_CARE_PROVIDER_SITE_OTHER): Payer: Federal, State, Local not specified - PPO | Admitting: *Deleted

## 2017-06-18 DIAGNOSIS — I4891 Unspecified atrial fibrillation: Secondary | ICD-10-CM

## 2017-06-18 DIAGNOSIS — Z5181 Encounter for therapeutic drug level monitoring: Secondary | ICD-10-CM | POA: Diagnosis not present

## 2017-06-18 DIAGNOSIS — Z7901 Long term (current) use of anticoagulants: Secondary | ICD-10-CM

## 2017-06-18 LAB — POCT INR: INR: 2 (ref 2.0–3.0)

## 2017-06-18 NOTE — Patient Instructions (Signed)
Description   Continue taking Coumadin 1.5 tablets daily.  Recheck in 6 weeks. [facility]madin Clinic [phone]     

## 2017-06-30 ENCOUNTER — Encounter: Payer: Self-pay | Admitting: Cardiology

## 2017-06-30 LAB — HM DIABETES EYE EXAM

## 2017-07-10 ENCOUNTER — Other Ambulatory Visit: Payer: Self-pay | Admitting: Cardiology

## 2017-07-16 ENCOUNTER — Telehealth: Payer: Self-pay | Admitting: Cardiology

## 2017-07-16 NOTE — Telephone Encounter (Signed)
New Message:       Pt's wife is calling and states the pt will be having a procedure done to his eye on 07/29/17. She states she has some question about him taking his warfarin.

## 2017-07-16 NOTE — Telephone Encounter (Signed)
Pt wife calling in as husbanding having eye surgery on 7/11 and she wants to know if they need to make adjustments to his Coumadin before the procedure.   I informed her she will need to contact the provider doing the procedure as they will need to send [place] the details in order to make the correct recommendations for the medication adjustment before the procedure, if there are any adjustments needed at all. She verbalized understanding. She was given our office fax in case eye doctor office needs it.

## 2017-07-17 ENCOUNTER — Telehealth: Payer: Self-pay | Admitting: Cardiology

## 2017-07-17 NOTE — Telephone Encounter (Signed)
Received records from [facility] on 07/17/17, Appt 09/17/17 @ 9:40AM. [place]

## 2017-08-06 ENCOUNTER — Ambulatory Visit (INDEPENDENT_AMBULATORY_CARE_PROVIDER_SITE_OTHER): Payer: Federal, State, Local not specified - PPO | Admitting: *Deleted

## 2017-08-06 ENCOUNTER — Encounter (INDEPENDENT_AMBULATORY_CARE_PROVIDER_SITE_OTHER): Payer: Self-pay

## 2017-08-06 DIAGNOSIS — I4891 Unspecified atrial fibrillation: Secondary | ICD-10-CM

## 2017-08-06 DIAGNOSIS — Z7901 Long term (current) use of anticoagulants: Secondary | ICD-10-CM

## 2017-08-06 DIAGNOSIS — Z5181 Encounter for therapeutic drug level monitoring: Secondary | ICD-10-CM

## 2017-08-06 LAB — POCT INR: INR: 2.1 (ref 2.0–3.0)

## 2017-08-06 NOTE — Patient Instructions (Signed)
Description   Continue taking Coumadin 1.5 tablets daily.  Recheck in 6 weeks. [facility]in Clinic [phone]

## 2017-09-02 ENCOUNTER — Encounter: Payer: Self-pay | Admitting: Cardiology

## 2017-09-14 ENCOUNTER — Telehealth: Payer: Self-pay | Admitting: Cardiology

## 2017-09-14 NOTE — Progress Notes (Signed)
HPI: FU hypertension, hyperlipidemia, and atrial fibrillation. Myoview performed on December 23, 2007 showed an ejection fraction 64% with no ischemia or infarction. Last echocardiogram was performed in November 2013 and revealed normal LV function, moderate biatrial enlargement, mild right ventricular enlargement, mild mitral regurgitation and trace aortic insufficiency. Previous TSH was normal. He has had a previous attempt at outpatient cardioversion. He transiently converted to sinus rhythm, but maintained sinus rhythm only for seconds. We have therefore elected to treat him with rate control and anticoagulation. A previous Holter monitor in February of 2010 showed that his rate was adequately controlled. Since I last saw him,  he has some dyspnea on exertion which is new.  No orthopnea, PND, chest pain, palpitations or syncope.  Minimal chronic pedal edema.  Current Outpatient Medications  Medication Sig Dispense Refill  . allopurinol (ZYLOPRIM) 300 MG tablet Take 300 mg by mouth daily.    Marland Kitchen atorvastatin (LIPITOR) 40 MG tablet Take 40 mg by mouth daily.    . hydrALAZINE (APRESOLINE) 50 MG tablet Take 100 mg by mouth 2 (two) times daily.     Marland Kitchen latanoprost (XALATAN) 0.005 % ophthalmic solution Place 1 drop into both eyes at bedtime.      Marland Kitchen losartan (COZAAR) 100 MG tablet Take 1 tablet (100 mg total) by mouth daily. 90 tablet 4  . metoprolol succinate (TOPROL-XL) 100 MG 24 hr tablet Take 150 mg by mouth daily. Take with or immediately following a meal.    . Multiple Vitamin (MULTIVITAMIN) tablet Take 1 tablet by mouth daily.      Marland Kitchen torsemide (DEMADEX) 20 MG tablet Take 1 tablet (20 mg total) by mouth daily. 30 tablet 12  . warfarin (COUMADIN) 4 MG tablet TAKE 1 AND 1/2 TABLETS DAILY AS DIRECTED BY COUMADIN CLINIC 45 tablet 4   No current facility-administered medications for this visit.      Past Medical History:  Diagnosis Date  . Aortic insufficiency   . Atrial fibrillation ([place])     . CKD (chronic kidney disease)   . Diverticulosis   . External hemorrhoids   . Glaucoma 2010  . Glucose intolerance (impaired glucose tolerance)   . Hearing loss of both ears 2012  . HTN (hypertension)   . Hyperlipidemia   . Left lumbar radiculopathy 2013  . Macular degeneration 2012   right  . Mitral regurgitation   . Prostate cancer ([place])    XRT  . Tricuspid regurgitation   . Tubular adenoma of colon 11/2004    Past Surgical History:  Procedure Laterality Date  . APPENDECTOMY    . CATARACT EXTRACTION  2008  . COLONOSCOPY  2006  . ELBOW FRACTURE SURGERY  1995    Social History   Socioeconomic History  . Marital status: Married    Spouse name: Not on file  . Number of children: Not on file  . Years of education: Not on file  . Highest education level: Not on file  Occupational History  . Occupation: retired  Scientific laboratory technician  . Financial resource strain: Not on file  . Food insecurity:    Worry: Not on file    Inability: Not on file  . Transportation needs:    Medical: Not on file    Non-medical: Not on file  Tobacco Use  . Smoking status: Never Smoker  . Smokeless tobacco: Never Used  Substance and Sexual Activity  . Alcohol use: Yes    Alcohol/week: 5.0 standard drinks    Types:  5 Glasses of wine per week    Comment: every afternoon  . Drug use: No  . Sexual activity: Not on file  Lifestyle  . Physical activity:    Days per week: Not on file    Minutes per session: Not on file  . Stress: Not on file  Relationships  . Social connections:    Talks on phone: Not on file    Gets together: Not on file    Attends religious service: Not on file    Active member of club or organization: Not on file    Attends meetings of clubs or organizations: Not on file    Relationship status: Not on file  . Intimate partner violence:    Fear of current or ex partner: Not on file    Emotionally abused: Not on file    Physically abused: Not on file    Forced sexual  activity: Not on file  Other Topics Concern  . Not on file  Social History Narrative  . Not on file    Family History  Problem Relation Age of Onset  . Kidney failure Mother     ROS: no fevers or chills, productive cough, hemoptysis, dysphasia, odynophagia, melena, hematochezia, dysuria, hematuria, rash, seizure activity, orthopnea, PND, pedal edema, claudication. Remaining systems are negative.  Physical Exam: Well-developed well-nourished in no acute distress.  Skin is warm and dry.  HEENT is normal.  Neck is supple.  Chest is clear to auscultation with normal expansion.  Cardiovascular exam is irregular Abdominal exam nontender or distended. No masses palpated. Extremities show trace edema. neuro grossly intact  ECG-atrial fibrillation with PVCs or aberrantly conducted beats.  No ST changes.  Personally reviewed  A/P  1 permanent atrial fibrillation-Continue metoprolol for rate control.  Continue Coumadin.  He has declined though ask because of expense.  Hemoglobin monitored by primary care.  Note he does have some dyspnea on exertion.  This is new compared to previous.  I will repeat his echocardiogram.  Check BNP.  We will also arrange a 24-hour Holter monitor to make sure that his rate is adequately controlled.  2 hypertension-blood pressure is controlled.  Continue present medications.  3 hyperlipidemia-continue statin.  Lipids and liver monitored by primary care.  4 lower extremity edema-plan to continue present dose of diuretics.  Check potassium and renal function.  Kirk Ruths, MD

## 2017-09-14 NOTE — Telephone Encounter (Signed)
Received records from [facility] on 09/14/17, Appt 09/17/17 @ 9:40AM. [place]

## 2017-09-16 ENCOUNTER — Encounter (INDEPENDENT_AMBULATORY_CARE_PROVIDER_SITE_OTHER): Payer: Self-pay

## 2017-09-16 ENCOUNTER — Ambulatory Visit: Payer: Federal, State, Local not specified - PPO | Admitting: *Deleted

## 2017-09-16 DIAGNOSIS — Z7901 Long term (current) use of anticoagulants: Secondary | ICD-10-CM

## 2017-09-16 DIAGNOSIS — I4891 Unspecified atrial fibrillation: Secondary | ICD-10-CM | POA: Diagnosis not present

## 2017-09-16 DIAGNOSIS — Z5181 Encounter for therapeutic drug level monitoring: Secondary | ICD-10-CM

## 2017-09-16 LAB — POCT INR: INR: 2.7 (ref 2.0–3.0)

## 2017-09-16 NOTE — Patient Instructions (Signed)
Description   Continue taking Coumadin 1.5 tablets daily.  Recheck in 6 weeks. Coumadin Clinic [phone]

## 2017-09-17 ENCOUNTER — Telehealth: Payer: Self-pay | Admitting: *Deleted

## 2017-09-17 ENCOUNTER — Encounter: Payer: Self-pay | Admitting: Cardiology

## 2017-09-17 ENCOUNTER — Ambulatory Visit: Payer: Federal, State, Local not specified - PPO | Admitting: Cardiology

## 2017-09-17 ENCOUNTER — Other Ambulatory Visit: Payer: Self-pay | Admitting: Cardiology

## 2017-09-17 VITALS — BP 138/82 | HR 85 | Ht 70.0 in | Wt 229.8 lb

## 2017-09-17 DIAGNOSIS — I482 Chronic atrial fibrillation: Secondary | ICD-10-CM | POA: Diagnosis not present

## 2017-09-17 DIAGNOSIS — R0602 Shortness of breath: Secondary | ICD-10-CM

## 2017-09-17 DIAGNOSIS — E7800 Pure hypercholesterolemia, unspecified: Secondary | ICD-10-CM | POA: Diagnosis not present

## 2017-09-17 DIAGNOSIS — I4821 Permanent atrial fibrillation: Secondary | ICD-10-CM

## 2017-09-17 DIAGNOSIS — I10 Essential (primary) hypertension: Secondary | ICD-10-CM | POA: Diagnosis not present

## 2017-09-17 LAB — PRO B NATRIURETIC PEPTIDE: NT-Pro BNP: 1656 pg/mL — ABNORMAL HIGH (ref 0–486)

## 2017-09-17 MED ORDER — METOPROLOL SUCCINATE ER 100 MG PO TB24: ORAL_TABLET | ORAL | 3 refills | Status: DC

## 2017-09-17 MED ORDER — TORSEMIDE 20 MG PO TABS: 30.0000 mg | ORAL_TABLET | Freq: Every day | ORAL | 12 refills | Status: DC

## 2017-09-17 NOTE — Telephone Encounter (Signed)
Rx sent to pharmacy   

## 2017-09-17 NOTE — Patient Instructions (Signed)
Medication Instructions:   NO CHANGE  Labwork:  Your physician recommends that you HAVE LAB WORK TODAY  Testing/Procedures:  Your physician has requested that you have an echocardiogram. Echocardiography is a painless test that uses sound waves to create images of your heart. It provides your doctor with information about the size and shape of your heart and how well your heart's chambers and valves are working. This procedure takes approximately one hour. There are no restrictions for this procedure.   Your physician has recommended that you wear a 24 HOUR holter monitor. Holter monitors are medical devices that record the heart's electrical activity. Doctors most often use these monitors to diagnose arrhythmias. Arrhythmias are problems with the speed or rhythm of the heartbeat. The monitor is a small, portable device. You can wear one while you do your normal daily activities. This is usually used to diagnose what is causing palpitations/syncope (passing out).    Follow-Up:  Your physician wants you to follow-up in: [place] will receive a reminder letter in the mail two months in advance. If you don't receive a letter, please call our office to schedule the follow-up appointment.   Any Other Special Instructions If you need a refill on your cardiac medications before your next appointment, please call your pharmacy.

## 2017-09-17 NOTE — Telephone Encounter (Signed)
Spoke with pt, Aware of dr crenshaw's recommendations.  Lab orders mailed to the pt  

## 2017-09-17 NOTE — Telephone Encounter (Signed)
-----   Message from [name], MD sent at 09/17/2017  4:46 PM EDT ----- Change demadex to 30 mg daily; bmet one week Kirk Ruths

## 2017-09-25 ENCOUNTER — Telehealth: Payer: Self-pay

## 2017-09-25 LAB — BASIC METABOLIC PANEL
BUN/Creatinine Ratio: 15 (ref 10–24)
BUN: 31 mg/dL — AB (ref 8–27)
CHLORIDE: 99 mmol/L (ref 96–106)
CO2: 26 mmol/L (ref 20–29)
Calcium: 10.2 mg/dL (ref 8.6–10.2)
Creatinine, Ser: 2.02 mg/dL — ABNORMAL HIGH (ref 0.76–1.27)
GFR calc Af Amer: 33 mL/min/{1.73_m2} — ABNORMAL LOW (ref >59)
GFR calc non Af Amer: 29 mL/min/{1.73_m2} — ABNORMAL LOW (ref >59)
GLUCOSE: 100 mg/dL — AB (ref 65–99)
Potassium: 4.6 mmol/L (ref 3.5–5.2)
SODIUM: 142 mmol/L (ref 134–144)

## 2017-09-25 NOTE — Telephone Encounter (Signed)
Pt aware of lab results with verbal understanding.

## 2017-09-25 NOTE — Telephone Encounter (Signed)
-----   Message from [name], MD sent at 09/25/2017  4:41 PM EDT ----- No change in meds. Ronnie Sullivan

## 2017-09-30 ENCOUNTER — Ambulatory Visit (INDEPENDENT_AMBULATORY_CARE_PROVIDER_SITE_OTHER): Payer: Federal, State, Local not specified - PPO

## 2017-09-30 ENCOUNTER — Ambulatory Visit (HOSPITAL_COMMUNITY): Payer: Federal, State, Local not specified - PPO | Attending: Cardiovascular Disease

## 2017-09-30 ENCOUNTER — Other Ambulatory Visit: Payer: Self-pay

## 2017-09-30 DIAGNOSIS — I071 Rheumatic tricuspid insufficiency: Secondary | ICD-10-CM | POA: Diagnosis not present

## 2017-09-30 DIAGNOSIS — R0602 Shortness of breath: Secondary | ICD-10-CM

## 2017-09-30 DIAGNOSIS — I482 Chronic atrial fibrillation: Secondary | ICD-10-CM

## 2017-09-30 DIAGNOSIS — I129 Hypertensive chronic kidney disease with stage 1 through stage 4 chronic kidney disease, or unspecified chronic kidney disease: Secondary | ICD-10-CM | POA: Insufficient documentation

## 2017-09-30 DIAGNOSIS — N189 Chronic kidney disease, unspecified: Secondary | ICD-10-CM | POA: Insufficient documentation

## 2017-09-30 DIAGNOSIS — E785 Hyperlipidemia, unspecified: Secondary | ICD-10-CM | POA: Insufficient documentation

## 2017-09-30 DIAGNOSIS — I4821 Permanent atrial fibrillation: Secondary | ICD-10-CM

## 2017-10-21 ENCOUNTER — Ambulatory Visit
Admission: RE | Admit: 2017-10-21 | Discharge: 2017-10-21 | Disposition: A | Discharge status: Home / Self Care | Payer: Federal, State, Local not specified - PPO | Source: Ambulatory Visit | Attending: Family Medicine | Admitting: Family Medicine

## 2017-10-21 ENCOUNTER — Other Ambulatory Visit: Payer: Self-pay | Admitting: Family Medicine

## 2017-10-21 DIAGNOSIS — R0600 Dyspnea, unspecified: Secondary | ICD-10-CM

## 2017-10-21 DIAGNOSIS — R042 Hemoptysis: Secondary | ICD-10-CM

## 2017-10-22 ENCOUNTER — Telehealth: Payer: Self-pay | Admitting: Cardiology

## 2017-10-22 NOTE — Telephone Encounter (Signed)
Cardiology [facility] Doctor of the Day phone note:  Contacted by Dr. Sandi Mariscal. Mr. Burchill is being seen in his office, with reports of several tablespoons per day of hemoptysis. CT scan has been done. Question is regarding management of warfarin with hemoptysis.  Differential on basis of chest CT was pneumonia/infection vs. Heart failure vs. Diffuse alveolar hemorrhage. Given the hemoptysis, recommended pulmonary referral. In the interim, ok to hold coumadin pending further evaluation. Unclear if H/H has dropped. He has follow up in cardiology scheduled in 5 days, we can reassess his symptoms, volume status, and coumadin at that time.   Buford Dresser, MD, PhD [facility]  [address] [place], [place] [address] [phone]

## 2017-10-23 ENCOUNTER — Other Ambulatory Visit: Payer: Federal, State, Local not specified - PPO

## 2017-10-26 NOTE — Progress Notes (Signed)
Cardiology Office Note   Date:  10/27/2017   ID:  Ronnie Sullivan, DOB 1928/05/30, MRN [id number]  PCP:  Derinda Late, MD  Cardiologist:  Stanford Breed  Chief Complaint  Patient presents with  . Atrial Fibrillation  . Hypertension     History of Present Illness: Ronnie Sullivan is a 84 y.o. male who presents for ongoing assessment and management of HTN, HL, and atrial fibrillation. He did have a DCCV which was temporarily successful but converted back to atrial fib. He was to be treated medically for rate control and anticoagulation. On last office visit on 09/17/2017 he was complaining of dyspnea. Echocardiogram was ordered and 24 hour Holter was planned to assess rate control.   Holter revealed atrial fib with PVC's rate controlled. Echo demonstrated normal LV fx EF of 55%-60%. LA severely dilated,moderately elevated pulmonary pressures, with mild LVH.   Dr. Harrell Gave spoke with Dr. Sandi Mariscal on 10/22/2017,(she was DOD that day) concerning his concern about the patient having episodes of hemoptysis. CT scan was ordered.  Coumadin was discontinued.   CT determined that he had pulmonary edema, diffuse alveolar hemorrhage, with scattered solid pulmonary nodules in both lungs. He was recommended to have follow up CT in 3 months.   He had not had any further hemoptysis since yesterday morning, He continues to have some productive coughing. He continues on antibiotic therapy.    Past Medical History:  Diagnosis Date  . Aortic insufficiency   . Atrial fibrillation ([place])   . CKD (chronic kidney disease)   . Diverticulosis   . External hemorrhoids   . Glaucoma 2010  . Glucose intolerance (impaired glucose tolerance)   . Hearing loss of both ears 2012  . HTN (hypertension)   . Hyperlipidemia   . Left lumbar radiculopathy 2013  . Macular degeneration 2012   right  . Mitral regurgitation   . Prostate cancer ([place])    XRT  . Tricuspid regurgitation   . Tubular adenoma of colon  11/2004    Past Surgical History:  Procedure Laterality Date  . APPENDECTOMY    . CATARACT EXTRACTION  2008  . COLONOSCOPY  2006  . ELBOW FRACTURE SURGERY  1995     Current Outpatient Medications  Medication Sig Dispense Refill  . allopurinol (ZYLOPRIM) 300 MG tablet Take 300 mg by mouth daily.    Marland Kitchen atorvastatin (LIPITOR) 40 MG tablet Take 40 mg by mouth daily.    . hydrALAZINE (APRESOLINE) 50 MG tablet Take 100 mg by mouth 2 (two) times daily.     Marland Kitchen latanoprost (XALATAN) 0.005 % ophthalmic solution Place 1 drop into both eyes at bedtime.      Marland Kitchen losartan (COZAAR) 100 MG tablet Take 1 tablet (100 mg total) by mouth daily. 90 tablet 4  . metoprolol succinate (TOPROL-XL) 100 MG 24 hr tablet Take one and one half tablets with or immediately following a meal. 135 tablet 3  . Multiple Vitamin (MULTIVITAMIN) tablet Take 1 tablet by mouth daily.      Marland Kitchen torsemide (DEMADEX) 20 MG tablet Take 1.5 tablets (30 mg total) by mouth daily. 30 tablet 12  . levofloxacin (LEVAQUIN) 500 MG tablet TAKE 1 TABLET IN THE MORNING FOR 10 DAYS  0   No current facility-administered medications for this visit.     Allergies:   Patient has no known allergies.    Social History:  The patient  reports that he has never smoked. He has never used smokeless tobacco. He reports that he drinks  about 5.0 standard drinks of alcohol per week. He reports that he does not use drugs.   Family History:  The patient's family history includes Kidney failure in his mother.    ROS: All other systems are reviewed and negative. Unless otherwise mentioned in H&P    PHYSICAL EXAM: VS:  BP 114/66   Pulse 67   Ht 5\' 10"  (1.778 m)   Wt 226 lb 12.8 oz (102.9 kg)   SpO2 96%   BMI 32.54 kg/m  , BMI Body mass index is 32.54 kg/m. GEN: Well nourished, well developed, in no acute distress HEENT: normal Neck: no JVD, carotid bruits, or masses Cardiac:IRRR; 1/6 systolic murmurs, rubs, or gallops,1+ dependent  edema  Respiratory:   Clear to auscultation bilaterally, normal work of breathing GI: soft, nontender, nondistended, + BS MS: no deformity or atrophy Skin: warm and dry, no rash Neuro:  Strength and sensation are intact Psych: euthymic mood, full affect   EKG: Not completed this office visit.   Recent Labs: 09/17/2017: NT-Pro BNP 1,656 09/25/2017: BUN 31; Creatinine, Ser 2.02; Potassium 4.6; Sodium 142    Lipid Panel No results found for: CHOL, TRIG, HDL, CHOLHDL, VLDL, LDLCALC, LDLDIRECT    Wt Readings from Last 3 Encounters:  10/27/17 226 lb 12.8 oz (102.9 kg)  09/17/17 229 lb 12.8 oz (104.2 kg)  09/02/16 237 lb (107.5 kg)      Other studies Reviewed: Echocardiogram 2017/10/07  Left ventricle: The cavity size was normal. Wall thickness was   increased in a pattern of mild LVH. Systolic function was normal.   The estimated ejection fraction was in the range of 55% to 60%.   Wall motion was normal; there were no regional wall motion   abnormalities. - Left atrium: The atrium was severely dilated. - Right ventricle: The cavity size was mildly dilated. - Right atrium: The atrium was moderately dilated. - Pulmonary arteries: Systolic pressure was moderately increased.   PA peak pressure: 55 mm Hg (S). Aortic valve:   Structurally normal valve.   Cusp separation was normal.  Doppler:  Transvalvular velocity was within the normal range. There was no stenosis. There was no regurgitation  CT Scan 10/21/2017 IMPRESSION: 1. Patchy ground-glass opacity throughout both lungs, most prominent in the mid to lower lungs. Minimal patchy peribronchovascular consolidation in the right middle and lower lobes. Mild interlobular septal thickening in both lungs. Findings are nonspecific with a broad differential including mild cardiogenic pulmonary edema (given the borderline mild cardiomegaly), diffuse alveolar hemorrhage, drug toxicity or multilobar infection. 2. Scattered solid pulmonary nodules in both lungs,  largest 7 mm. Given the history of prostate cancer and the absence of prior comparison chest CT, close chest CT follow-up is suggested in 3 months. 3. Dilated main pulmonary artery, suggesting pulmonary arterial hypertension. 4. Two vessel coronary atherosclerosis. 5. Cholelithiasis.  Aortic Atherosclerosis (ICD10-I70.0) and Emphysema (ICD10-J43.9).  ASSESSMENT AND PLAN:  1. Atrial fib: He is appropriately off coumadin due to hemoptysis. I have offered ASA therapy but he has refused at this time. CHADS VASC Score of 3. He will see [place] again in 2 weeks. He has not had further hemoptysis for the last 24 hours.  I have discussed this with Dr. Stanford Breed who is in agreement with this plan. Will change him to the [facility] office coumadin clinic at their request.   2. Pulmonary Edema: Torsemide continues at 30 mg daily. He has no overt signs of fluid overload. Lungs are clear. Will repeat BMET on next visit  Concerned that hemoptysis is from inflammatory process of infection and edema.   3. Aortic Insufficiency: Echo was recently completed normal Aov.   4. Respiratory Infection: Continue Levoquin. He is to see PCP for close follow up.   Current medicines are reviewed at length with the patient today.    Labs/ tests ordered today include:  None  [name]. [place] Pugh, ANP, AACC   10/27/2017 12:42 PM    [place]  [address] Office [phone] Fax [phone]

## 2017-10-27 ENCOUNTER — Encounter: Payer: Self-pay | Admitting: Adult Health

## 2017-10-27 ENCOUNTER — Ambulatory Visit (INDEPENDENT_AMBULATORY_CARE_PROVIDER_SITE_OTHER): Payer: Federal, State, Local not specified - PPO | Admitting: Adult Health

## 2017-10-27 VITALS — BP 114/66 | HR 67 | Ht 70.0 in | Wt 226.8 lb

## 2017-10-27 DIAGNOSIS — I4821 Permanent atrial fibrillation: Secondary | ICD-10-CM | POA: Diagnosis not present

## 2017-10-27 DIAGNOSIS — Z79899 Other long term (current) drug therapy: Secondary | ICD-10-CM

## 2017-10-27 DIAGNOSIS — J189 Pneumonia, unspecified organism: Secondary | ICD-10-CM

## 2017-10-27 NOTE — Patient Instructions (Signed)
Medication Instructions:  HOLD COUMADIN FOR 2 WEEK UNTIL FOLLOW UP APPOINTMENT If you need a refill on your cardiac medications before your next appointment, please call your pharmacy.  Labwork: CBC AND BMET A FEW DAYS BEFORE FOLLOW UP APPT  You will NOT need to fast  HERE IN OUR OFFICE AT [facility]  Take the provided lab slips with you to the lab for your blood draw.   If you have labs (blood work) drawn today and your tests are completely normal, you will receive your results only by: Marland Kitchen MyChart Message (if you have MyChart) OR . A paper copy in the mail If you have any lab test that is abnormal or we need to change your treatment, we will call you to review the results.  Follow-Up: At [facility], you and your health needs are our priority.  As part of our continuing mission to provide you with exceptional heart care, we have created designated Provider Care Teams.  These Care Teams include your primary Cardiologist (physician) and Advanced Practice Providers (APPs -  Physician Assistants and Nurse Practitioners) who all work together to provide you with the care you need, when you need it. . You will need a follow up appointment in 2 WEEKS.  You may see  DR Stanford Breed or one of the Advanced Practice Providers on your designated Care Team -OR- Curt Bears LAWRENCE DNP,AACC(NURSE PRACTITIONER)  Thank you for choosing [facility] at [facility]!!

## 2017-11-03 ENCOUNTER — Ambulatory Visit: Payer: Federal, State, Local not specified - PPO | Admitting: Internal Medicine

## 2017-11-03 ENCOUNTER — Encounter: Payer: Self-pay | Admitting: Internal Medicine

## 2017-11-03 VITALS — BP 140/80 | HR 75 | Ht 70.0 in | Wt 225.0 lb

## 2017-11-03 DIAGNOSIS — I482 Chronic atrial fibrillation, unspecified: Secondary | ICD-10-CM | POA: Diagnosis not present

## 2017-11-03 DIAGNOSIS — R042 Hemoptysis: Secondary | ICD-10-CM

## 2017-11-03 NOTE — Patient Instructions (Signed)
Come to outpatient registration at [facility]  at [place]  Thursday 11/05/17  with nothing to eat or drink after midnight Wednesday .for Bronchoscopy

## 2017-11-03 NOTE — Progress Notes (Signed)
Ronnie Sullivan, male    DOB: 31-Aug-1928,     MRN: [id number]    Brief patient profile:  11 yowm cig/pipe quit completely 1971 on coumadin   longterm for  CAF   with new onset hemoptyis Oct 1 with 1-2 tbsp dark blood and coumadin d/c 10/22/17 and since then tapered down but not  resolved without a pattern with ct 10/21/17 >  No def source >Dr Sandi Mariscal rx levaquin > no change in symptoms so referred to pulmonary clinic 11/03/2017     History of Present Illness  11/03/2017  Pulmonary / 1st office eval   Chief Complaint  Patient presents with  . Consult    Coughing blood since 10/20/17   Dyspnea:  50 ft / legs weak and hurt before stops due to any breathing issues  Cough: no chronic cough/ no nosebleeds  Sleep: bed flat on side /one pillows SABA use: no inhaler   No obvious day to day or daytime variability or assoc excess/ purulent sputum or mucus plugs   or cp or chest tightness, subjective wheeze or overt sinus or hb symptoms.   sleeping as above  without nocturnal  or early am exacerbation  of respiratory  c/o's or need for noct saba. Also denies any obvious fluctuation of symptoms with weather or environmental changes or other aggravating or alleviating factors except as outlined above   No unusual exposure hx or h/o childhood pna/ asthma or knowledge of premature birth.  Current Allergies, Complete Past Medical History, Past Surgical History, Family History, and Social History were reviewed in [employer] record.  ROS  The following are not active complaints unless bolded Hoarseness, sore throat, dysphagia, dental problems, itching, sneezing,  nasal congestion or discharge of excess mucus or purulent secretions, ear ache,   fever, chills, sweats, unintended wt loss or wt gain, classically pleuritic or exertional cp,  orthopnea pnd or arm/hand swelling  or leg swelling, presyncope, palpitations, abdominal pain, anorexia, nausea, vomiting, diarrhea  or change in  bowel habits or change in bladder habits, change in stools or change in urine, dysuria, hematuria,  rash, arthralgias, visual complaints, headache, numbness, weakness or ataxia or problems with walking or coordination,  change in mood or  memory.             Past Medical History:  Diagnosis Date  . Aortic insufficiency   . Atrial fibrillation ([place])   . CKD (chronic kidney disease)   . Diverticulosis   . External hemorrhoids   . Glaucoma 2010  . Glucose intolerance (impaired glucose tolerance)   . Hearing loss of both ears 2012  . HTN (hypertension)   . Hyperlipidemia   . Left lumbar radiculopathy 2013  . Macular degeneration 2012   right  . Mitral regurgitation   . Prostate cancer ([place])    XRT  . Tricuspid regurgitation   . Tubular adenoma of colon 11/2004    Outpatient Medications Prior to Visit  Medication Sig Dispense Refill  . allopurinol (ZYLOPRIM) 300 MG tablet Take 300 mg by mouth daily.    Marland Kitchen atorvastatin (LIPITOR) 40 MG tablet Take 40 mg by mouth daily.    . hydrALAZINE (APRESOLINE) 50 MG tablet Take 100 mg by mouth 2 (two) times daily.     Marland Kitchen latanoprost (XALATAN) 0.005 % ophthalmic solution Place 1 drop into both eyes at bedtime.      Marland Kitchen losartan (COZAAR) 100 MG tablet Take 1 tablet (100 mg total) by mouth daily. [place]  tablet 4  . metoprolol succinate (TOPROL-XL) 100 MG 24 hr tablet Take one and one half tablets with or immediately following a meal. 135 tablet 3  . Multiple Vitamin (MULTIVITAMIN) tablet Take 1 tablet by mouth daily.      Marland Kitchen torsemide (DEMADEX) 20 MG tablet Take 1.5 tablets (30 mg total) by mouth daily. 30 tablet 12  . levofloxacin (LEVAQUIN) 500 MG tablet TAKE 1 TABLET IN THE MORNING FOR 10 DAYS  0              Objective:     BP 140/80 (BP Location: Left Arm, Patient Position: Sitting, Cuff Size: Normal)   Pulse 75   Ht 5\' 10"  (1.778 m)   Wt 225 lb (102.1 kg)   SpO2 100%   BMI 32.28 kg/m   SpO2: 100 %  RA      HEENT: nl   turbinates  bilaterally s crusting bleeding and nl oropharynx. Nl external ear canals without cough reflex/ poor dentition esp upper molars "between dentists right now"   NECK :  without JVD/Nodes/TM/ nl carotid upstrokes bilaterally   LUNGS: no acc muscle use,  Nl contour chest which is clear to A and P bilaterally without cough on insp or exp maneuvers   CV:  RRR  no s3 or murmur or increase in P2, and no edema   ABD:  soft and nontender with nl inspiratory excursion in the supine position. No bruits or organomegaly appreciated, bowel sounds nl  MS:  Nl gait/ ext warm without deformities, calf tenderness, cyanosis or clubbing No obvious joint restrictions   SKIN: warm and dry without lesions    NEURO:  alert, approp, nl sensorium with  no motor or cerebellar deficits apparent.      I personally reviewed images and agree with radiology impression as follows:   Chest CT s contrast 10/21/17 1. Patchy ground-glass opacity throughout both lungs, most prominent in the mid to lower lungs. Minimal patchy peribronchovascular consolidation in the right middle and lower lobes. Mild interlobular septal thickening in both lungs. Findings are nonspecific with a broad differential including mild cardiogenic pulmonary edema (given the borderline mild cardiomegaly), diffuse alveolar hemorrhage, drug toxicity or multilobar infection. 2. Scattered solid pulmonary nodules in both lungs, largest 7 mm. Given the history of prostate cancer and the absence of prior comparison chest CT, close chest CT follow-up is suggested in 3 months. 3. Dilated main pulmonary artery, suggesting pulmonary arterial hypertension. 4. Two vessel coronary atherosclerosis. 5. Cholelithiasis.     Assessment   Hemoptysis D/c'd coumadin 10/22/17 but persisted p levaquin empirically  - CT 10/21/17 s source  - FOB 11/05/17 rec   Persistent low grade hemoptysis after rx for bronchitis (which I really couldn't get much hx to suggest  but agree fully with trying this first) in pt who needs to be on coumadin longterm for AFib but agree can't be using this until identify the source.  Explained to pt/wife that CT's are not reliably sensitive enough  for airway sources of bleeding and best study was fob. Discussed in detail all the  indications, usual  risks and alternatives  relative to the benefits with patient who agrees to proceed with bronchoscopy 11/05/17      ATRIAL FIBRILLATION Echo 09/30/17 with nl ef but LAE severe/ RAE moderate    Clearly the RAE is due to LAE in this setting and not primary PAH but doubt the bleeding is from decompensated chf clinically.  Agree for now with  holding all coumadin/ asa etc      Total time devoted to counseling  > 50 % of initial 60 min office visit:  review case with pt/wife discussion of options/alternatives/ personally creating written customized instructions  in presence of pt  then going over those specific  Instructions directly with the pt including how to use all of the meds but in particular covering each new medication in detail and the difference between the maintenance= "automatic" meds and the prns using an action plan format for the latter (If this problem/symptom => do that organization reading Left to right).  Please see AVS from this visit for a full list of these instructions which I personally wrote for this pt and  are unique to this visit.      Christinia Gully, MD 11/03/2017

## 2017-11-04 ENCOUNTER — Telehealth: Payer: Self-pay | Admitting: Internal Medicine

## 2017-11-04 ENCOUNTER — Encounter: Payer: Self-pay | Admitting: Internal Medicine

## 2017-11-04 DIAGNOSIS — R042 Hemoptysis: Secondary | ICD-10-CM | POA: Insufficient documentation

## 2017-11-04 NOTE — Telephone Encounter (Signed)
Ok to take meds with a very small amt of water when wake up

## 2017-11-04 NOTE — Assessment & Plan Note (Signed)
D/c'd coumadin 10/22/17 but persisted p levaquin empirically  - CT 10/21/17 s source  - FOB 11/05/17 rec   Persistent low grade hemoptysis after rx for bronchitis (which I really couldn't get much hx to suggest but agree fully with trying this first) in pt who needs to be on coumadin longterm for AFib but agree can't be using this until identify the source.  Explained to pt/wife that CT's are not reliably sensitive enough  for airway sources of bleeding and best study was fob. Discussed in detail all the  indications, usual  risks and alternatives  relative to the benefits with patient who agrees to proceed with bronchoscopy 11/05/17

## 2017-11-04 NOTE — Telephone Encounter (Signed)
Called pt's spouse Earlie Server who wanted clarifications to see if pt could take his morning meds tomorrow, 10/17 knowing he has the bronch scheduled tomorrow morning or if he needs to hold off on taking the meds.  Dorothy stated the schedulers of the bronch have already called the pt and stated to her to call our office with clarifications on pt's meds if to take or not to take.  Dr. Melvyn Novas, please advise on this for pt. Thanks!

## 2017-11-04 NOTE — Assessment & Plan Note (Signed)
Echo 09/30/17 with nl ef but LAE severe/ RAE moderate    Clearly the RAE is due to LAE in this setting and not primary PAH but doubt the bleeding is from decompensated chf clinically.  Agree for now with holding all coumadin/ asa etc      Total time devoted to counseling  > 50 % of initial 60 min office visit:  review case with pt/wife discussion of options/alternatives/ personally creating written customized instructions  in presence of pt  then going over those specific  Instructions directly with the pt including how to use all of the meds but in particular covering each new medication in detail and the difference between the maintenance= "automatic" meds and the prns using an action plan format for the latter (If this problem/symptom => do that organization reading Left to right).  Please see AVS from this visit for a full list of these instructions which I personally wrote for this pt and  are unique to this visit.

## 2017-11-04 NOTE — H&P (Signed)
Ronnie Sullivan, male    DOB: 27-Jan-1928,     MRN: [id number]    Brief patient profile:  30 yowm cig/pipe quit completely 1971 on coumadin   longterm for  CAF   with new onset hemoptyis Oct 1 with 1-2 tbsp dark blood and coumadin d/c 10/22/17 and since then tapered down but not  resolved without a pattern with ct 10/21/17 >  No def source >Dr Sandi Mariscal rx levaquin > no change in symptoms so referred to pulmonary clinic 11/03/2017     History of Present Illness  11/03/2017  Pulmonary / 1st office eval       Chief Complaint  Patient presents with  . Consult    Coughing blood since 10/20/17   Dyspnea:  50 ft / legs weak and hurt before stops due to any breathing issues  Cough: no chronic cough/ no nosebleeds  Sleep: bed flat on side /one pillows SABA use: no inhaler   No obvious day to day or daytime variability or assoc excess/ purulent sputum or mucus plugs   or cp or chest tightness, subjective wheeze or overt sinus or hb symptoms.   sleeping as above  without nocturnal  or early am exacerbation  of respiratory  c/o's or need for noct saba. Also denies any obvious fluctuation of symptoms with weather or environmental changes or other aggravating or alleviating factors except as outlined above   No unusual exposure hx or h/o childhood pna/ asthma or knowledge of premature birth.  Current Allergies, Complete Past Medical History, Past Surgical History, Family History, and Social History were reviewed in [employer] record.  ROS  The following are not active complaints unless bolded Hoarseness, sore throat, dysphagia, dental problems, itching, sneezing,  nasal congestion or discharge of excess mucus or purulent secretions, ear ache,   fever, chills, sweats, unintended wt loss or wt gain, classically pleuritic or exertional cp,  orthopnea pnd or arm/hand swelling  or leg swelling, presyncope, palpitations, abdominal pain, anorexia, nausea, vomiting, diarrhea   or change in bowel habits or change in bladder habits, change in stools or change in urine, dysuria, hematuria,  rash, arthralgias, visual complaints, headache, numbness, weakness or ataxia or problems with walking or coordination,  change in mood or  memory.                 Past Medical History:  Diagnosis Date  . Aortic insufficiency   . Atrial fibrillation ([place])   . CKD (chronic kidney disease)   . Diverticulosis   . External hemorrhoids   . Glaucoma 2010  . Glucose intolerance (impaired glucose tolerance)   . Hearing loss of both ears 2012  . HTN (hypertension)   . Hyperlipidemia   . Left lumbar radiculopathy 2013  . Macular degeneration 2012   right  . Mitral regurgitation   . Prostate cancer ([place])    XRT  . Tricuspid regurgitation   . Tubular adenoma of colon 11/2004          Outpatient Medications Prior to Visit  Medication Sig Dispense Refill  . allopurinol (ZYLOPRIM) 300 MG tablet Take 300 mg by mouth daily.    Marland Kitchen atorvastatin (LIPITOR) 40 MG tablet Take 40 mg by mouth daily.    . hydrALAZINE (APRESOLINE) 50 MG tablet Take 100 mg by mouth 2 (two) times daily.     Marland Kitchen latanoprost (XALATAN) 0.005 % ophthalmic solution Place 1 drop into both eyes at bedtime.      Marland Kitchen losartan (COZAAR)  100 MG tablet Take 1 tablet (100 mg total) by mouth daily. 90 tablet 4  . metoprolol succinate (TOPROL-XL) 100 MG 24 hr tablet Take one and one half tablets with or immediately following a meal. 135 tablet 3  . Multiple Vitamin (MULTIVITAMIN) tablet Take 1 tablet by mouth daily.      Marland Kitchen torsemide (DEMADEX) 20 MG tablet Take 1.5 tablets (30 mg total) by mouth daily. 30 tablet 12  . levofloxacin (LEVAQUIN) 500 MG tablet TAKE 1 TABLET IN THE MORNING FOR 10 DAYS  0              Objective:   BP 140/80 (BP Location: Left Arm, Patient Position: Sitting, Cuff Size: Normal)   Pulse 75   Ht 5\' 10"  (1.778 m)   Wt 225 lb (102.1 kg)   SpO2 100%   BMI 32.28  kg/m   SpO2: 100 %  RA      HEENT: nl   turbinates bilaterally s crusting bleeding and nl oropharynx. Nl external ear canals without cough reflex/ poor dentition esp upper molars "between dentists right now"   NECK :  without JVD/Nodes/TM/ nl carotid upstrokes bilaterally   LUNGS: no acc muscle use,  Nl contour chest which is clear to A and P bilaterally without cough on insp or exp maneuvers   CV:  RRR  no s3 or murmur or increase in P2, and no edema   ABD:  soft and nontender with nl inspiratory excursion in the supine position. No bruits or organomegaly appreciated, bowel sounds nl  MS:  Nl gait/ ext warm without deformities, calf tenderness, cyanosis or clubbing No obvious joint restrictions   SKIN: warm and dry without lesions    NEURO:  alert, approp, nl sensorium with  no motor or cerebellar deficits apparent.      I personally reviewed images and agree with radiology impression as follows:   Chest CT s contrast 10/21/17 1. Patchy ground-glass opacity throughout both lungs, most prominent in the mid to lower lungs. Minimal patchy peribronchovascular consolidation in the right middle and lower lobes. Mild interlobular septal thickening in both lungs. Findings are nonspecific with a broad differential including mild cardiogenic pulmonary edema (given the borderline mild cardiomegaly), diffuse alveolar hemorrhage, drug toxicity or multilobar infection. 2. Scattered solid pulmonary nodules in both lungs, largest 7 mm. Given the history of prostate cancer and the absence of prior comparison chest CT, close chest CT follow-up is suggested in 3 months. 3. Dilated main pulmonary artery, suggesting pulmonary arterial hypertension. 4. Two vessel coronary atherosclerosis. 5. Cholelithiasis.     Assessment   Hemoptysis D/c'd coumadin 10/22/17 but persisted p levaquin empirically  - CT 10/21/17 s source  - FOB 11/05/17 rec   Persistent low grade  hemoptysis after rx for bronchitis (which I really couldn't get much hx to suggest but agree fully with trying this first) in pt who needs to be on coumadin longterm for AFib but agree can't be using this until identify the source.  Explained to pt/wife that CT's are not reliably sensitive enough  for airway sources of bleeding and best study was fob. Discussed in detail all the  indications, usual  risks and alternatives  relative to the benefits with patient who agrees to proceed with bronchoscopy 11/05/17      ATRIAL FIBRILLATION Echo 09/30/17 with nl ef but LAE severe/ RAE moderate    Clearly the RAE is due to LAE in this setting and not primary PAH but doubt the  bleeding is from decompensated chf clinically.  Agree for now with holding all coumadin/ asa etc     11/04/2017  Day of FOB, no change hx or exam - still active low grade hemoptysis.    Christinia Gully, MD Pulmonary and [place] [phone] After 5:30 PM or weekends, use [facility] [phone]

## 2017-11-04 NOTE — Telephone Encounter (Signed)
Called and spoke with pt's wife Earlie Server letting her know that pt can take meds with a small amount of water. Dorothy expressed understanding. Nothing further needed.

## 2017-11-05 ENCOUNTER — Encounter (HOSPITAL_COMMUNITY): Payer: Self-pay | Admitting: Internal Medicine

## 2017-11-05 ENCOUNTER — Encounter (HOSPITAL_COMMUNITY)
Admission: RE | Disposition: A | Discharge status: Home / Self Care | Payer: Self-pay | Source: Ambulatory Visit | Attending: Internal Medicine

## 2017-11-05 ENCOUNTER — Ambulatory Visit (HOSPITAL_COMMUNITY)
Admission: RE | Admit: 2017-11-05 | Discharge: 2017-11-05 | Disposition: A | Discharge status: Home / Self Care | Payer: Federal, State, Local not specified - PPO | Source: Ambulatory Visit | Attending: Internal Medicine | Admitting: Internal Medicine

## 2017-11-05 DIAGNOSIS — I083 Combined rheumatic disorders of mitral, aortic and tricuspid valves: Secondary | ICD-10-CM | POA: Diagnosis not present

## 2017-11-05 DIAGNOSIS — Z8546 Personal history of malignant neoplasm of prostate: Secondary | ICD-10-CM | POA: Diagnosis not present

## 2017-11-05 DIAGNOSIS — N189 Chronic kidney disease, unspecified: Secondary | ICD-10-CM | POA: Insufficient documentation

## 2017-11-05 DIAGNOSIS — R042 Hemoptysis: Secondary | ICD-10-CM | POA: Diagnosis not present

## 2017-11-05 DIAGNOSIS — E785 Hyperlipidemia, unspecified: Secondary | ICD-10-CM | POA: Insufficient documentation

## 2017-11-05 DIAGNOSIS — Z79899 Other long term (current) drug therapy: Secondary | ICD-10-CM | POA: Diagnosis not present

## 2017-11-05 DIAGNOSIS — I482 Chronic atrial fibrillation, unspecified: Secondary | ICD-10-CM | POA: Diagnosis not present

## 2017-11-05 DIAGNOSIS — Z87891 Personal history of nicotine dependence: Secondary | ICD-10-CM | POA: Diagnosis not present

## 2017-11-05 DIAGNOSIS — Z7901 Long term (current) use of anticoagulants: Secondary | ICD-10-CM | POA: Insufficient documentation

## 2017-11-05 DIAGNOSIS — I130 Hypertensive heart and chronic kidney disease with heart failure and stage 1 through stage 4 chronic kidney disease, or unspecified chronic kidney disease: Secondary | ICD-10-CM | POA: Insufficient documentation

## 2017-11-05 HISTORY — PX: VIDEO BRONCHOSCOPY: SHX5072

## 2017-11-05 SURGERY — VIDEO BRONCHOSCOPY WITHOUT FLUORO
Anesthesia: Moderate Sedation | Laterality: Bilateral

## 2017-11-05 MED ORDER — MEPERIDINE HCL 100 MG/ML IJ SOLN: 100.0000 mg | Freq: Once | INTRAMUSCULAR | Status: DC

## 2017-11-05 MED ORDER — MIDAZOLAM HCL 5 MG/ML IJ SOLN: 1.0000 mg | Freq: Once | INTRAMUSCULAR | Status: DC

## 2017-11-05 MED ORDER — SODIUM CHLORIDE 0.9 % IV SOLN
INTRAVENOUS | Status: DC
  Administered 2017-11-05: 07:00:00 via INTRAVENOUS

## 2017-11-05 MED ORDER — MIDAZOLAM HCL 10 MG/2ML IJ SOLN
INTRAMUSCULAR | Status: DC | PRN
  Administered 2017-11-05: 2.5 mg via INTRAVENOUS

## 2017-11-05 MED ORDER — LIDOCAINE HCL 1 % IJ SOLN
INTRAMUSCULAR | Status: DC | PRN
  Administered 2017-11-05: 6 mL via RESPIRATORY_TRACT

## 2017-11-05 MED ORDER — MIDAZOLAM HCL 5 MG/ML IJ SOLN
INTRAMUSCULAR | Status: AC
  Filled 2017-11-05: qty 2

## 2017-11-05 MED ORDER — PHENYLEPHRINE HCL 0.25 % NA SOLN
NASAL | Status: DC | PRN
  Administered 2017-11-05: 2 via NASAL

## 2017-11-05 MED ORDER — MEPERIDINE HCL 100 MG/ML IJ SOLN
INTRAMUSCULAR | Status: AC
  Filled 2017-11-05: qty 2

## 2017-11-05 MED ORDER — LIDOCAINE HCL 2 % EX GEL: 1.0000 "application " | Freq: Once | CUTANEOUS | Status: DC

## 2017-11-05 MED ORDER — LIDOCAINE HCL URETHRAL/MUCOSAL 2 % EX GEL
CUTANEOUS | Status: DC | PRN
  Administered 2017-11-05: 1

## 2017-11-05 MED ORDER — PHENYLEPHRINE HCL 0.25 % NA SOLN: 1.0000 | Freq: Four times a day (QID) | NASAL | Status: DC | PRN

## 2017-11-05 NOTE — Discharge Instructions (Signed)
Flexible Bronchoscopy, Care After These instructions give you information on caring for yourself after your procedure. Your doctor may also give you more specific instructions. Call your doctor if you have any problems or questions after your procedure. Follow these instructions at home:  Do not eat or drink anything for 2 hours after your procedure. If you try to eat or drink before the medicine wears off, food or drink could go into your lungs. You could also burn yourself.  After 2 hours have passed and when you can cough and gag normally, you may eat soft food and drink liquids slowly.  The day after the test, you may eat your normal diet.  You may do your normal activities.  Keep all doctor visits. Get help right away if:  You get more and more short of breath.  You get light-headed.  You feel like you are going to pass out (faint).  You have chest pain.  You have new problems that worry you.  You cough up more than a little blood.  You cough up more blood than before. This information is not intended to replace advice given to you by your health care provider. Make sure you discuss any questions you have with your health care provider. Document Released: 11/03/2008 Document Revised: 06/14/2015 Document Reviewed: 09/10/2012 Elsevier Interactive Patient Education  2017 [place].  Nothing to eat or drink until   0945  am  Today   11/05/2017 Any questions or concerns please call the office at [phone]

## 2017-11-05 NOTE — Op Note (Signed)
Bronchoscopy Procedure Note  Date of Operation: 11/05/2017   Pre-op Diagnosis: hemoptysis  Post-op Diagnosis: same, likely from aspirated blood from upper airway  Surgeon: Christinia Gully  Anesthesia: Monitored Local Anesthesia with Sedation Time Started: 0731 total of versed 2.5 mg IV  Time Stopped:  9449  Operation: Video Flexible fiberoptic bronchoscopy, diagnostic   Findings: nl airways  Specimen: BAL RML   Estimated Blood Loss: non  Complications: none  Indications and History: See updated H and P same date. The risks, benefits, complications, treatment options and expected outcomes were discussed with the patient.  The possibilities of reaction to medication, pulmonary aspiration, perforation of a viscus, bleeding, failure to diagnose a condition and creating a complication requiring transfusion or operation were discussed with the patient who freely signed the consent.    Description of Procedure: The patient was re-examined in the bronchoscopy suite and the site of surgery properly noted/marked.  The patient was identified  and the procedure verified as Flexible Fiberoptic Bronchoscopy.  A Time Out was held and the above information confirmed.   After the induction of topical nasopharyngeal anesthesia, the patient was positioned  and the bronchoscope was passed through the R naris. The vocal cords were visualized and  1% buffered lidocaine 5 ml was topically placed onto the cords. The cords were nl. The scope was then passed into the trachea.  1% buffered lidocaine given topically. Airways inspected bilaterally to the subsegmental level with the following findings:  Airways were pristine below the cords There was only looking blood right above the cords prior to passing the scope thru but no source identified.    Interventions: BAL RML for cyt/ afb    Rec:  ENT eval of upper airways prior to considering starting back on any anticoagulation     The Patient was taken to  the Endoscopy Recovery area in satisfactory condition.  Attestation: I performed the procedure.  Christinia Gully, MD Pulmonary and [place] [phone] After 5:30 PM or weekends, call [phone]

## 2017-11-05 NOTE — Progress Notes (Signed)
Video Bronchoscopy done Intervention Bronchial washing Procedure tolerated well

## 2017-11-06 LAB — ACID FAST SMEAR (AFB): ACID FAST SMEAR - AFSCU2: NEGATIVE

## 2017-11-09 ENCOUNTER — Telehealth: Payer: Self-pay | Admitting: Internal Medicine

## 2017-11-09 NOTE — Telephone Encounter (Signed)
Pt's wife Earlie Server (dpr on file) is calling back regarding bronch results. I was unable to relay results as I am sitting up front on the phones today.   Routing to Triage to follow up on.

## 2017-11-09 NOTE — Telephone Encounter (Signed)
All neg so far so set up f/u ov with me p sees [facility] - call for early appt if bleeding or breathing worsen off coumadin

## 2017-11-09 NOTE — Progress Notes (Signed)
Cardiology Office Note   Date:  11/10/2017   ID:  Ronnie Sullivan, DOB [birth year], MRN [id number]  PCP:  Derinda Late, MD  Cardiologist: Dr. Stanford Breed  Chief Complaint  Patient presents with  . Follow-up  . Atrial Fibrillation     History of Present Illness: Ronnie Sullivan is a 84 y.o. male who presents for ongoing assessment and management of hypertension, Atrial fibrillation, HL. Hx of DCCV but patient converted back to atrial fib. Holter revealed atrial fib with PVC's rate controlled. Echo demonstrated normal LV fx EF of 55%-60%. LA severely dilated,moderately elevated pulmonary pressures, with mild LVH.  He had episode of hemoptysis, coumadin was discontinued, and CT scan was ordered. CT demonstrated pulmonary edema, diffuse alveolar hemorrhage, with scattered pulmonary nodules. Follow up CT was recommended in 3 months   The patient had a fiberoptic bronchoscopy by Dr.Wert on 11/05/2017. There was no identifiable source of bleeding as lungs were "pristine" per Dr. Melvyn Novas. He was referred to ENT as it was felt the bleeding was from upper airway source.   He has not seen Dr. Melvyn Novas yet but is to see him on follow up tomorrow. He has not yet seen ENT. He continues to have mild dark blood which he is coughing up. "Very small amounts."  He continues off of coumadin.   Past Medical History:  Diagnosis Date  . Aortic insufficiency   . Atrial fibrillation ([place])   . CKD (chronic kidney disease)   . Diverticulosis   . External hemorrhoids   . Glaucoma 2010  . Glucose intolerance (impaired glucose tolerance)   . Hearing loss of both ears 2012  . HTN (hypertension)   . Hyperlipidemia   . Left lumbar radiculopathy 2013  . Macular degeneration 2012   right  . Mitral regurgitation   . Prostate cancer ([place])    XRT  . Tricuspid regurgitation   . Tubular adenoma of colon 11/2004    Past Surgical History:  Procedure Laterality Date  . APPENDECTOMY    . CATARACT EXTRACTION  2008  .  COLONOSCOPY  2006  . [place]  . VIDEO BRONCHOSCOPY Bilateral 11/05/2017   Procedure: VIDEO BRONCHOSCOPY WITHOUT FLUORO;  Surgeon: Tanda Rockers, MD;  Location: [facility];  Service: Cardiopulmonary;  Laterality: Bilateral;     Current Outpatient Medications  Medication Sig Dispense Refill  . acetaminophen (TYLENOL) 500 MG tablet Take 1,000 mg by mouth 2 (two) times daily.    Marland Kitchen allopurinol (ZYLOPRIM) 300 MG tablet Take 300 mg by mouth daily.    Marland Kitchen atorvastatin (LIPITOR) 40 MG tablet Take 40 mg by mouth daily.    . hydrALAZINE (APRESOLINE) 50 MG tablet Take 100 mg by mouth 2 (two) times daily.     Marland Kitchen latanoprost (XALATAN) 0.005 % ophthalmic solution Place 1 drop into both eyes at bedtime.      Marland Kitchen losartan (COZAAR) 100 MG tablet Take 1 tablet (100 mg total) by mouth daily. 90 tablet 4  . metoprolol succinate (TOPROL-XL) 100 MG 24 hr tablet Take one and one half tablets with or immediately following a meal. (Patient taking differently: Take 150 mg by mouth daily. Take one and one half tablets with or immediately following a meal.) 135 tablet 3  . Multiple Vitamin (MULTIVITAMIN) tablet Take 1 tablet by mouth daily.      Marland Kitchen torsemide (DEMADEX) 20 MG tablet Take 1.5 tablets (30 mg total) by mouth daily. 30 tablet 12   No current facility-administered medications for this  visit.     Allergies:   Patient has no known allergies.    Social History:  The patient  reports that he quit smoking about 48 years ago. His smoking use included cigarettes and pipe. He started smoking about 71 years ago. He smoked 1.00 pack per day. He has never used smokeless tobacco. He reports that he drinks about 5.0 standard drinks of alcohol per week. He reports that he does not use drugs.   Family History:  The patient's family history includes Kidney failure in his mother.    ROS: All other systems are reviewed and negative. Unless otherwise mentioned in H&P    PHYSICAL EXAM: VS:  BP 130/70    Pulse 86   Ht 5\' 10"  (1.778 m)   Wt 225 lb 9.6 oz (102.3 kg)   BMI 32.37 kg/m  , BMI Body mass index is 32.37 kg/m. GEN: Well nourished, well developed, in no acute distress HEENT: normal Neck: no JVD, carotid bruits, or masses Cardiac: RRR; no murmurs, rubs, or gallops,no edema  Respiratory:  Clear to auscultation bilaterally, normal work of breathing GI: soft, nontender, nondistended, + BS MS: no deformity or atrophy Skin: warm and dry, no rash Neuro:  Strength and sensation are intact Psych: euthymic mood, full affect   EKG: Not completed this office visit.   Recent Labs: 09/17/2017: NT-Pro BNP 1,656 11/09/2017: BUN 32; Creatinine, Ser 2.49; Hemoglobin 11.2; Platelets 262; Potassium 4.7; Sodium 138    Lipid Panel No results found for: CHOL, TRIG, HDL, CHOLHDL, VLDL, LDLCALC, LDLDIRECT    Wt Readings from Last 3 Encounters:  11/10/17 225 lb 9.6 oz (102.3 kg)  11/03/17 225 lb (102.1 kg)  10/27/17 226 lb 12.8 oz (102.9 kg)      Other studies Reviewed: Echocardiogram 10-Oct-2017 Left ventricle: The cavity size was normal. Wall thickness was   increased in a pattern of mild LVH. Systolic function was normal.   The estimated ejection fraction was in the range of 55% to 60%.   Wall motion was normal; there were no regional wall motion   abnormalities. - Left atrium: The atrium was severely dilated. - Right ventricle: The cavity size was mildly dilated. - Right atrium: The atrium was moderately dilated. - Pulmonary arteries: Systolic pressure was moderately increased.   PA peak pressure: 55 mm Hg (S).  Holter Monitor 10/10/17 Atrial fibrillation with PVCs or aberrantly conducted beats.  Rate controlled. Kirk Ruths, MD  ASSESSMENT AND PLAN:  1. Atrial fibrillation: He has stopped coumadin due to hemoptysis. He will be placed on ECASA 81 mg daily. After seen by ENT will restart coumadin. He refuses change to DOAC as this is too expensive for him. HR is is well  controlled on metoprolol.  2. Hypertension: He remains on hydralazine. Good control.  3. Hypercholesterolemia: Stay on atorvastatin. Labs every 6 months.   Current medicines are reviewed at length with the patient today.    Labs/ tests ordered today include: None   [name]. [place] Pugh, ANP, AACC   11/10/2017 12:00 PM    [place] [address] Office [phone] Fax [phone]

## 2017-11-09 NOTE — Telephone Encounter (Signed)
Pt is aware of below results and voiced his understanding. Pt has been scheduled for OV on 11/11/17. Nothing further is needed.

## 2017-11-09 NOTE — Telephone Encounter (Signed)
Pt had bronch performed 10/17 by MW.  Called pt's spouse Earlie Server who was wanting to know if the bronch results have come back yet.  Dr. Melvyn Novas, please advise on this for pt and his wife Earlie Server. Thanks!

## 2017-11-10 ENCOUNTER — Encounter: Payer: Self-pay | Admitting: Adult Health

## 2017-11-10 ENCOUNTER — Ambulatory Visit: Payer: Federal, State, Local not specified - PPO | Admitting: Adult Health

## 2017-11-10 ENCOUNTER — Telehealth: Payer: Self-pay | Admitting: *Deleted

## 2017-11-10 VITALS — BP 130/70 | HR 86 | Ht 70.0 in | Wt 225.6 lb

## 2017-11-10 DIAGNOSIS — R042 Hemoptysis: Secondary | ICD-10-CM

## 2017-11-10 DIAGNOSIS — E7800 Pure hypercholesterolemia, unspecified: Secondary | ICD-10-CM

## 2017-11-10 DIAGNOSIS — Z87898 Personal history of other specified conditions: Secondary | ICD-10-CM

## 2017-11-10 DIAGNOSIS — I10 Essential (primary) hypertension: Secondary | ICD-10-CM | POA: Diagnosis not present

## 2017-11-10 DIAGNOSIS — Z8709 Personal history of other diseases of the respiratory system: Secondary | ICD-10-CM

## 2017-11-10 DIAGNOSIS — R911 Solitary pulmonary nodule: Secondary | ICD-10-CM

## 2017-11-10 DIAGNOSIS — I4891 Unspecified atrial fibrillation: Secondary | ICD-10-CM

## 2017-11-10 LAB — CBC
Hematocrit: 34.3 % — ABNORMAL LOW (ref 37.5–51.0)
Hemoglobin: 11.2 g/dL — ABNORMAL LOW (ref 13.0–17.7)
MCH: 32.7 pg (ref 26.6–33.0)
MCHC: 32.7 g/dL (ref 31.5–35.7)
MCV: 100 fL — ABNORMAL HIGH (ref 79–97)
Platelets: 262 10*3/uL (ref 150–450)
RBC: 3.43 x10E6/uL — ABNORMAL LOW (ref 4.14–5.80)
RDW: 13.2 % (ref 12.3–15.4)
WBC: 8.1 10*3/uL (ref 3.4–10.8)

## 2017-11-10 LAB — BASIC METABOLIC PANEL
BUN/Creatinine Ratio: 13 (ref 10–24)
BUN: 32 mg/dL — ABNORMAL HIGH (ref 8–27)
CALCIUM: 9.9 mg/dL (ref 8.6–10.2)
CHLORIDE: 99 mmol/L (ref 96–106)
CO2: 23 mmol/L (ref 20–29)
Creatinine, Ser: 2.49 mg/dL — ABNORMAL HIGH (ref 0.76–1.27)
GFR calc Af Amer: 26 mL/min/{1.73_m2} — ABNORMAL LOW (ref >59)
GFR calc non Af Amer: 22 mL/min/{1.73_m2} — ABNORMAL LOW (ref >59)
GLUCOSE: 93 mg/dL (ref 65–99)
Potassium: 4.7 mmol/L (ref 3.5–5.2)
SODIUM: 138 mmol/L (ref 134–144)

## 2017-11-10 NOTE — Telephone Encounter (Signed)
Spoke with the pt and notified of recs per MW  He verbalized understanding  Referral was made

## 2017-11-10 NOTE — Telephone Encounter (Signed)
-----   Message from Tanda Rockers, MD sent at 11/05/2017  7:50 AM EDT ----- Refer to [place]/ ent dx  Bleeding above the vocal cords ? source

## 2017-11-10 NOTE — Patient Instructions (Signed)
Medication Instructions:  NO CHANGES- Your physician recommends that you continue on your current medications as directed. Please refer to the Current Medication list given to you today.  If you need a refill on your cardiac medications before your next appointment, please call your pharmacy.  Labwork: If you have labs (blood work) drawn today and your tests are completely normal, you will receive your results only by: Marland Kitchen MyChart Message (if you have MyChart) OR . A paper copy in the mail If you have any lab test that is abnormal or we need to change your treatment, we will call you to review the results.  Testing/Procedures: FOLLOW UP CT IN January 2020  Special Instructions: CONTINUE TO HOLD COUMADIN UNTIL AFTER ENT APPOINTMENT-CALL IF YOU HAVE ANY QUESTIONS  Follow-Up: You will need a follow up appointment in 3 MONTHS.   You may see  DR Orma Flaming, DNP or one of the following Advanced Practice Providers on your designated Care Team:    . Kerin Ransom, PA-C  At [facility], you and your health needs are our priority.  As part of our continuing mission to provide you with exceptional heart care, we have created designated Provider Care Teams.  These Care Teams include your primary Cardiologist (physician) and Advanced Practice Providers (APPs -  Physician Assistants and Nurse Practitioners) who all work together to provide you with the care you need, when you need it.  Thank you for choosing [facility] at [facility]!!

## 2017-11-11 ENCOUNTER — Ambulatory Visit (INDEPENDENT_AMBULATORY_CARE_PROVIDER_SITE_OTHER): Payer: Federal, State, Local not specified - PPO | Admitting: Internal Medicine

## 2017-11-11 ENCOUNTER — Encounter: Payer: Self-pay | Admitting: Internal Medicine

## 2017-11-11 VITALS — BP 124/80 | HR 67 | Ht 70.0 in | Wt 225.0 lb

## 2017-11-11 DIAGNOSIS — R042 Hemoptysis: Secondary | ICD-10-CM | POA: Diagnosis not present

## 2017-11-11 NOTE — Progress Notes (Signed)
Ronnie Sullivan, male    DOB: 03-24-1928,     MRN: [id number]    Brief patient profile:  68 yowm cig/pipe quit completely 1971 on coumadin   longterm for  CAF   with new onset hemoptyis Oct 1 with 1-2 tbsp dark blood and coumadin d/c 10/22/17 and since then tapered down but not  resolved without a pattern with ct 10/21/17 >  No def source >Dr Sandi Mariscal rx levaquin > no change in symptoms so referred to pulmonary clinic 11/03/2017     History of Present Illness  11/03/2017  Pulmonary / 1st office eval   Chief Complaint  Patient presents with  . Consult    Coughing blood since 10/20/17   Dyspnea:  50 ft / legs weak and hurt before stops due to any breathing issues  Cough: no chronic cough/ no nosebleeds  Sleep: bed flat on side /one pillows SABA use: no inhaler rec - FOB 11/05/17 >  Pristine airways, some old blood above cords > rec ENT eval next     11/11/2017  f/u ov/Ronnie Sullivan re: hemoptysis ? Etiology, rec to start  back on asa 81 mg daily by cards but hasn't yet Chief Complaint  Patient presents with  . Follow-up    Per patient, to follow up on bronch results. States breathing has been ok since last visit.   Dyspnea: no change  Cough: resolved  Sleeping: flat/ on side  SABA use: none 02: none     No obvious day to day or daytime variability or assoc excess/ purulent sputum or mucus plugs or ongoing  hemoptysis or cp or chest tightness, subjective wheeze or overt sinus or hb symptoms.   Sleeping fine as above  without nocturnal  or early am exacerbation  of respiratory  c/o's or need for noct saba. Also denies any obvious fluctuation of symptoms with weather or environmental changes or other aggravating or alleviating factors except as outlined above   No unusual exposure hx or h/o childhood pna/ asthma or knowledge of premature birth.  Current Allergies, Complete Past Medical History, Past Surgical History, Family History, and Social History were reviewed in [facility] record.  ROS  The following are not active complaints unless bolded Hoarseness, sore throat, dysphagia, dental problems, itching, sneezing,  nasal congestion or discharge of excess mucus or purulent secretions, ear ache,   fever, chills, sweats, unintended wt loss or wt gain, classically pleuritic or exertional cp,  orthopnea pnd or arm/hand swelling  or leg swelling, presyncope, palpitations, abdominal pain, anorexia, nausea, vomiting, diarrhea  or change in bowel habits or change in bladder habits, change in stools or change in urine, dysuria, hematuria,  rash, arthralgias, visual complaints, headache, numbness, weakness or ataxia or problems with walking or coordination,  change in mood or  memory.        Current Meds  Medication Sig  . acetaminophen (TYLENOL) 500 MG tablet Take 1,000 mg by mouth 2 (two) times daily.  Marland Kitchen allopurinol (ZYLOPRIM) 300 MG tablet Take 300 mg by mouth daily.  Marland Kitchen atorvastatin (LIPITOR) 40 MG tablet Take 40 mg by mouth daily.  . hydrALAZINE (APRESOLINE) 50 MG tablet Take 100 mg by mouth 2 (two) times daily.   Marland Kitchen latanoprost (XALATAN) 0.005 % ophthalmic solution Place 1 drop into both eyes at bedtime.    Marland Kitchen losartan (COZAAR) 100 MG tablet Take 1 tablet (100 mg total) by mouth daily.  . metoprolol succinate (TOPROL-XL) 100 MG 24 hr tablet Take  one and one half tablets with or immediately following a meal. (Patient taking differently: Take 150 mg by mouth daily. Take one and one half tablets with or immediately following a meal.)  . Multiple Vitamin (MULTIVITAMIN) tablet Take 1 tablet by mouth daily.    Marland Kitchen torsemide (DEMADEX) 20 MG tablet Take 1.5 tablets (30 mg total) by mouth daily.           Objective:    amb wm nad  Wt Readings from Last 3 Encounters:  11/11/17 225 lb (102.1 kg)  11/10/17 225 lb 9.6 oz (102.3 kg)  11/03/17 225 lb (102.1 kg)     Vital signs reviewed - Note on arrival 02 sats  98% on RA        HEENT:    poor dentition esp  upper molars - nl  turbinates bilaterally, and oropharynx. Nl external ear canals without cough reflex   NECK :  without JVD/Nodes/TM/ nl carotid upstrokes bilaterally   LUNGS: no acc muscle use,  Nl contour chest which is clear to A and P bilaterally without cough on insp or exp maneuvers   CV:  IRIR   no s3  -  II/VI hsm   - no  increase in P2, and no edema   ABD:  soft and nontender with nl inspiratory excursion in the supine position. No bruits or organomegaly appreciated, bowel sounds nl  MS:  Nl gait/ ext warm without deformities, calf tenderness, cyanosis or clubbing No obvious joint restrictions   SKIN: warm and dry without lesions    NEURO:  alert, approp, nl sensorium with  no motor or cerebellar deficits apparent.      Labs ordered 11/11/2017  Bnp/ esr but did not go to lab as rec         Assessment

## 2017-11-11 NOTE — Patient Instructions (Addendum)
Ok to start baby aspirin 81 mg one daily with food but stop immediately if any bleeding    Please remember to go to the lab department downstairs in the basement  for your tests - we will call you with the results when they are available.   Pulmonary follow up is as needed after you see ENT

## 2017-11-13 ENCOUNTER — Encounter: Payer: Self-pay | Admitting: Internal Medicine

## 2017-11-13 NOTE — Assessment & Plan Note (Addendum)
Echo 9/11/19Left ventricle: The cavity size was normal. Wall thickness was   increased in a pattern of mild LVH. Systolic function was normal.   The estimated ejection fraction was in the range of 55% to 60%.   Wall motion was normal; there were no regional wall motion   abnormalities. - Left atrium: The atrium was severely dilated. - Right ventricle: The cavity size was mildly dilated. - Right atrium: The atrium was moderately dilated. - Pulmonary arteries: Systolic pressure was moderately increased.   PA peak pressure: 55 mm Hg   D/c'd coumadin 10/22/17 but persisted p levaquin empirically  - CT 10/21/17 s source  - FOB 11/05/17 >  Pristine airways, some old blood above cords > rec ENT eval next   - 11/11/2017 resumed asa 81 mg daily   [place] with nl cytology and afb smears neg with cultures pending.    He has some vague GG changes on CT  which are likely just edema or some blood but absence of any bleeding at all into airway rules against significant alv hem or pulmonary source for blood.  I did rec BNP and ESR to sort out hydrostatic vs inflammatory GG changes on Ct but he did not go to lab as rec and would consider checking these if sob or hemoptysis recur.  rec Complete ent eval Ok to take baby asa If upper airway eval neg by ent ok to restart minimally therapeutic levels of coumadin from my perspetive  Discussed in detail all the  indications, usual  risks and alternatives  relative to the benefits with patient who agrees to proceed with w/u and rx as outlined.      Pulmonary f/u can be prn   I had an extended discussion with the patient and wife  reviewing all relevant studies completed to date and  lasting 15 to 20 minutes of a 25 minute visit    Each maintenance medication was reviewed in detail including most importantly the difference between maintenance and prns and under what circumstances the prns are to be triggered using an action plan format that is not  reflected in the computer generated alphabetically organized AVS.     Please see AVS for specific instructions unique to this visit that I personally wrote and verbalized to the the pt in detail and then reviewed with pt  by my nurse highlighting any  changes in therapy recommended at today's visit to their plan of care.     

## 2017-12-14 ENCOUNTER — Encounter: Payer: Self-pay | Admitting: Cardiology

## 2017-12-16 ENCOUNTER — Telehealth: Payer: Self-pay | Admitting: Adult Health

## 2017-12-16 ENCOUNTER — Other Ambulatory Visit (HOSPITAL_COMMUNITY): Payer: Self-pay | Admitting: Family Medicine

## 2017-12-16 DIAGNOSIS — R270 Ataxia, unspecified: Secondary | ICD-10-CM

## 2017-12-16 NOTE — Telephone Encounter (Signed)
Returned call to Dr. Deboraha Sprang office concerning coumadin for Ronnie Sullivan. Was told by receptionist that he was in a room performing a physical and could not come to the phone. I requested that he call me back when he has time.   I did talk to Pharm D concerning restarting coumadin. He should restart 4 mg daily, and have appointment with pharmacy in one week after restarting.

## 2017-12-16 NOTE — Telephone Encounter (Signed)
Called pt he states that he has not been taking his coumadin since 11-10-17 he was to hold until his visit with ENT, his appt was 11-24-2017 at ENT and he has not re-started. Pt informed that he should re-start his coumadin. Informed pt to start 4mg , he states that he usually takes in the afternoon so, he will re-start this afternoon. I have made pt an appt for Tues 12-3 @ 10am to start coming here for his coumadin dosing. Will forward to CV[facility]

## 2017-12-16 NOTE — Telephone Encounter (Signed)
New Message:     Pt's primary doctor called last night and wanted pt to check with Jory Sims, to see if pt should continue his Warfarin. Dr Derinda Late would like to talk to Jory Sims, concerning this matter please. Please call him at [phone]. n

## 2017-12-20 LAB — ACID FAST CULTURE WITH REFLEXED SENSITIVITIES: ACID FAST CULTURE - AFSCU3: NEGATIVE

## 2017-12-22 ENCOUNTER — Ambulatory Visit (INDEPENDENT_AMBULATORY_CARE_PROVIDER_SITE_OTHER): Payer: Federal, State, Local not specified - PPO | Admitting: Pharmacist Clinician (PhC)/ Clinical Pharmacy Specialist

## 2017-12-22 DIAGNOSIS — I4891 Unspecified atrial fibrillation: Secondary | ICD-10-CM

## 2017-12-22 DIAGNOSIS — Z7901 Long term (current) use of anticoagulants: Secondary | ICD-10-CM | POA: Diagnosis not present

## 2017-12-22 DIAGNOSIS — Z5181 Encounter for therapeutic drug level monitoring: Secondary | ICD-10-CM | POA: Diagnosis not present

## 2017-12-22 LAB — POCT INR: INR: 1.2 — AB (ref 2.0–3.0)

## 2017-12-22 NOTE — Progress Notes (Signed)
Spoke with pt and notified of results per Dr. Wert. Pt verbalized understanding and denied any questions. 

## 2017-12-22 NOTE — Patient Instructions (Signed)
Description   Increase dose to 1 tablet daily except 1.5 tablets each Tuesday, Thursday and Saturday.  Repeat INR in 1 week

## 2017-12-23 ENCOUNTER — Ambulatory Visit (HOSPITAL_COMMUNITY)
Admission: RE | Admit: 2017-12-23 | Discharge: 2017-12-23 | Disposition: A | Discharge status: Home / Self Care | Payer: Federal, State, Local not specified - PPO | Source: Ambulatory Visit | Attending: Family Medicine | Admitting: Family Medicine

## 2017-12-23 DIAGNOSIS — R270 Ataxia, unspecified: Secondary | ICD-10-CM | POA: Insufficient documentation

## 2017-12-30 ENCOUNTER — Ambulatory Visit (INDEPENDENT_AMBULATORY_CARE_PROVIDER_SITE_OTHER): Payer: Federal, State, Local not specified - PPO | Admitting: Pharmacist Clinician (PhC)/ Clinical Pharmacy Specialist

## 2017-12-30 DIAGNOSIS — Z7901 Long term (current) use of anticoagulants: Secondary | ICD-10-CM

## 2017-12-30 DIAGNOSIS — I4891 Unspecified atrial fibrillation: Secondary | ICD-10-CM

## 2017-12-30 DIAGNOSIS — Z5181 Encounter for therapeutic drug level monitoring: Secondary | ICD-10-CM

## 2017-12-30 LAB — POCT INR: INR: 1.6 — AB (ref 2.0–3.0)

## 2018-01-06 ENCOUNTER — Ambulatory Visit (INDEPENDENT_AMBULATORY_CARE_PROVIDER_SITE_OTHER): Payer: Federal, State, Local not specified - PPO | Admitting: Pharmacist

## 2018-01-06 DIAGNOSIS — I4891 Unspecified atrial fibrillation: Secondary | ICD-10-CM | POA: Diagnosis not present

## 2018-01-06 DIAGNOSIS — Z7901 Long term (current) use of anticoagulants: Secondary | ICD-10-CM

## 2018-01-06 DIAGNOSIS — Z5181 Encounter for therapeutic drug level monitoring: Secondary | ICD-10-CM | POA: Diagnosis not present

## 2018-01-06 LAB — POCT INR: INR: 1.9 — AB (ref 2.0–3.0)

## 2018-01-06 NOTE — Patient Instructions (Signed)
Increase warfarin dose to 1.5 tablets daily except 1 tablet each Monday and Friday  Repeat INR in 3 weeks

## 2018-01-21 ENCOUNTER — Other Ambulatory Visit: Payer: Self-pay | Admitting: Cardiology

## 2018-01-27 ENCOUNTER — Ambulatory Visit (INDEPENDENT_AMBULATORY_CARE_PROVIDER_SITE_OTHER): Payer: Federal, State, Local not specified - PPO | Admitting: Pharmacist

## 2018-01-27 DIAGNOSIS — Z7901 Long term (current) use of anticoagulants: Secondary | ICD-10-CM

## 2018-01-27 DIAGNOSIS — Z5181 Encounter for therapeutic drug level monitoring: Secondary | ICD-10-CM

## 2018-01-27 DIAGNOSIS — I4891 Unspecified atrial fibrillation: Secondary | ICD-10-CM | POA: Diagnosis not present

## 2018-01-27 LAB — POCT INR: INR: 2.2 (ref 2.0–3.0)

## 2018-01-27 NOTE — Patient Instructions (Signed)
Continue warfarin dose to 1.5 tablets daily except 1 tablet each Monday and Friday  Repeat INR in 4 weeks

## 2018-02-16 ENCOUNTER — Ambulatory Visit (INDEPENDENT_AMBULATORY_CARE_PROVIDER_SITE_OTHER)
Admission: RE | Admit: 2018-02-16 | Discharge: 2018-02-16 | Disposition: A | Discharge status: Home / Self Care | Payer: Federal, State, Local not specified - PPO | Source: Ambulatory Visit | Attending: Adult Health | Admitting: Adult Health

## 2018-02-16 DIAGNOSIS — R911 Solitary pulmonary nodule: Secondary | ICD-10-CM | POA: Diagnosis not present

## 2018-02-16 DIAGNOSIS — R042 Hemoptysis: Secondary | ICD-10-CM

## 2018-02-16 DIAGNOSIS — Z8709 Personal history of other diseases of the respiratory system: Secondary | ICD-10-CM

## 2018-02-16 DIAGNOSIS — Z87898 Personal history of other specified conditions: Secondary | ICD-10-CM

## 2018-02-18 ENCOUNTER — Telehealth: Payer: Self-pay | Admitting: Adult Health

## 2018-02-18 ENCOUNTER — Telehealth: Payer: Self-pay | Admitting: *Deleted

## 2018-02-18 NOTE — Addendum Note (Signed)
Addended by: Alvina Filbert B on: 02/18/2018 03:37 PM   Modules accepted: Level of Service, SmartSet

## 2018-02-18 NOTE — Telephone Encounter (Signed)
This encounter was created in error - please disregard.

## 2018-02-18 NOTE — Telephone Encounter (Signed)
Received call from Sherri with Dr Deboraha Sprang office. Dr Sandi Mariscal would like Arnold Long DNP to call him Monday around 1:00 pm to discuss patient. Please call [phone]. Will forward to Dr Arnold Long DNP

## 2018-02-18 NOTE — Telephone Encounter (Signed)
New message    Sherri from pt pcp office DR. Blomgren calling to speak with someone regarding pt care.

## 2018-02-22 ENCOUNTER — Telehealth: Payer: Self-pay | Admitting: Adult Health

## 2018-02-22 NOTE — Telephone Encounter (Signed)
I spoke to Dr. Sandi Mariscal. Thank you.

## 2018-02-23 NOTE — Telephone Encounter (Signed)
Noted-will let Dr Deboraha Sprang office notify pt

## 2018-02-23 NOTE — Telephone Encounter (Signed)
-----   Message from Lendon Colonel, NP sent at 02/23/2018 10:05 AM EST ----- Regarding: RE: CT chest results I do not plan on doing anything new at this time. This was a follow up CT recommended by radiologist after being seen by Dr. Sandi Mariscal in October. I spoke with Dr. Sandi Mariscal by phone yesterday who would like to continue to treat him and manage his diuretic therapy, returning him to cardiology when he became more symptomatic or fluid was uncontrolled.  He felt it would be less confusing for the patient to have one person treating the symptoms unless CHF became more of a concern.   KL ----- Message ----- From: Waylan Rocher, LPN Sent: 01/25/1094   9:27 AM EST To: Lendon Colonel, NP Subject: CT chest results                               Here is another one that I do not think that you received the results for.  What would you like to do??  Thanks--M

## 2018-02-24 ENCOUNTER — Ambulatory Visit (INDEPENDENT_AMBULATORY_CARE_PROVIDER_SITE_OTHER): Payer: Federal, State, Local not specified - PPO | Admitting: Pharmacist

## 2018-02-24 DIAGNOSIS — I4891 Unspecified atrial fibrillation: Secondary | ICD-10-CM | POA: Diagnosis not present

## 2018-02-24 DIAGNOSIS — Z5181 Encounter for therapeutic drug level monitoring: Secondary | ICD-10-CM | POA: Diagnosis not present

## 2018-02-24 LAB — POCT INR: INR: 2.3 (ref 2.0–3.0)

## 2018-02-27 NOTE — Progress Notes (Signed)
Cardiology Office Note   Date:  03/01/2018   ID:  Ronnie Sullivan, DOB November 05, 1928, MRN [id number]  PCP:  Derinda Late, MD  Cardiologist:  [facility]  Chief Complaint  Patient presents with  . Atrial Fibrillation  . Hypertension     History of Present Illness: Ronnie Sullivan is a 85 y.o. male who presents for ongoing assessment management of hypertension,HL,  atrial fibrillation, with hx of DCCV but he returned to atrial fibrillation.   The patient had a fiberoptic bronchoscopy by Dr.Wert on 11/05/2017. There was no identifiable source of bleeding as lungs were "pristine" per Dr. Melvyn Novas. He was referred to ENT as it was felt the bleeding was from upper airway source. He was to restart his coumadin.   Dr. Sandi Mariscal has requested to manage his diuretic therapy per phone call on 02/22/2018. Coumadin is managed by Pharm D.   He comes today without complaints. I have discussed with him the phone call with  Dr. Rosanne Sack are in agreement with this to avoid confusion. He denies any hemoptysis, or epistaxis.   Past Medical History:  Diagnosis Date  . Aortic insufficiency   . Atrial fibrillation ([place])   . CKD (chronic kidney disease)   . Diverticulosis   . External hemorrhoids   . Glaucoma 2010  . Glucose intolerance (impaired glucose tolerance)   . Hearing loss of both ears 2012  . HTN (hypertension)   . Hyperlipidemia   . Left lumbar radiculopathy 2013  . Macular degeneration 2012   right  . Mitral regurgitation   . Prostate cancer ([place])    XRT  . Tricuspid regurgitation   . Tubular adenoma of colon 11/2004    Past Surgical History:  Procedure Laterality Date  . APPENDECTOMY    . CATARACT EXTRACTION  2008  . COLONOSCOPY  2006  . [place]  . VIDEO BRONCHOSCOPY Bilateral 11/05/2017   Procedure: VIDEO BRONCHOSCOPY WITHOUT FLUORO;  Surgeon: Tanda Rockers, MD;  Location: [place];  Service: Cardiopulmonary;  Laterality: Bilateral;      Current Outpatient Medications  Medication Sig Dispense Refill  . acetaminophen (TYLENOL) 500 MG tablet Take 1,000 mg by mouth 2 (two) times daily.    Marland Kitchen allopurinol (ZYLOPRIM) 300 MG tablet Take 300 mg by mouth daily.    Marland Kitchen atorvastatin (LIPITOR) 40 MG tablet Take 40 mg by mouth daily.    . ciprofloxacin (CIPRO) 500 MG tablet Take 500 mg by mouth 2 (two) times daily.    Marland Kitchen doxazosin (CARDURA) 1 MG tablet Take 2 tablets by mouth at bedtime.    Marland Kitchen latanoprost (XALATAN) 0.005 % ophthalmic solution Place 1 drop into both eyes at bedtime.      Marland Kitchen losartan (COZAAR) 100 MG tablet Take 1 tablet (100 mg total) by mouth daily. 90 tablet 4  . metoprolol succinate (TOPROL-XL) 100 MG 24 hr tablet Take one and one half tablets with or immediately following a meal. (Patient taking differently: Take 150 mg by mouth daily. Take one and one half tablets with or immediately following a meal.) 135 tablet 3  . Multiple Vitamin (MULTIVITAMIN) tablet Take 1 tablet by mouth daily.      Marland Kitchen torsemide (DEMADEX) 20 MG tablet Take 1.5 tablets (30 mg total) by mouth daily. 30 tablet 12  . warfarin (COUMADIN) 4 MG tablet TAKE 1 AND 1/2 TABLETS DAILY AS DIRECTED BY COUMADIN CLINIC 135 tablet 0   No current facility-administered medications for this visit.     Allergies:  Patient has no known allergies.    Social History:  The patient  reports that he quit smoking about 49 years ago. His smoking use included cigarettes and pipe. He started smoking about 71 years ago. He smoked 1.00 pack per day. He has never used smokeless tobacco. He reports current alcohol use of about 5.0 standard drinks of alcohol per week. He reports that he does not use drugs.   Family History:  The patient's family history includes Kidney failure in his mother.    ROS: All other systems are reviewed and negative. Unless otherwise mentioned in H&P    PHYSICAL EXAM: VS:  BP 140/72   Pulse 95   Ht 5\' 9"  (1.753 m)   Wt 218 lb 6.4 oz (99.1 kg)    BMI 32.25 kg/m  , BMI Body mass index is 32.25 kg/m. GEN: Well nourished, well developed, in no acute distress HEENT: normal Neck: no JVD, carotid bruits, or masses Cardiac: IRRR; soft 1/6 systolic  murmurs, rubs, or gallops,no edema  Respiratory:  Clear to auscultation bilaterally, normal work of breathing GI: soft, nontender, nondistended, + BS MS: no deformity or atrophy Skin: warm and dry, no rash Neuro:  Strength and sensation are intact Psych: euthymic mood, full affect   EKG:  Not completed this office visit.    Recent Labs: 09/17/2017: NT-Pro BNP 1,656 11/09/2017: BUN 32; Creatinine, Ser 2.49; Hemoglobin 11.2; Platelets 262; Potassium 4.7; Sodium 138    Lipid Panel No results found for: CHOL, TRIG, HDL, CHOLHDL, VLDL, LDLCALC, LDLDIRECT    Wt Readings from Last 3 Encounters:  03/01/18 218 lb 6.4 oz (99.1 kg)  11/11/17 225 lb (102.1 kg)  11/10/17 225 lb 9.6 oz (102.3 kg)      Other studies Reviewed: Left ventricle: The cavity size was normal. Wall thickness was   increased in a pattern of mild LVH. Systolic function was normal.   The estimated ejection fraction was in the range of 55% to 60%.   Wall motion was normal; there were no regional wall motion   abnormalities. - Left atrium: The atrium was severely dilated. - Right ventricle: The cavity size was mildly dilated. - Right atrium: The atrium was moderately dilated. - Pulmonary arteries: Systolic pressure was moderately increased.   PA peak pressure: 55 mm Hg (S).  ASSESSMENT AND PLAN:  1. Atrial fib: Rate is controlled on metoprolol. He will have PT-INR checked on this visit by Pharm D. See recommendations.   2. Hypertension: BP is moderately controlled. Has been lower on previous visits. Will not make changes at this time, but follow. He will continue on losartan.   3. LEE: Dr.Blomgren to manage fluid status as his request. He remains on torsemide.   Current medicines are reviewed at length with the  patient today.    Labs/ tests ordered today include: None  [name]. [place], ANP, AACC   03/01/2018 12:18 PM    [address] [place] Office [phone] Fax [phone]

## 2018-03-01 ENCOUNTER — Encounter: Payer: Self-pay | Admitting: Adult Health

## 2018-03-01 ENCOUNTER — Ambulatory Visit: Payer: Federal, State, Local not specified - PPO | Admitting: Adult Health

## 2018-03-01 ENCOUNTER — Ambulatory Visit (INDEPENDENT_AMBULATORY_CARE_PROVIDER_SITE_OTHER): Payer: Federal, State, Local not specified - PPO | Admitting: *Deleted

## 2018-03-01 VITALS — BP 140/72 | HR 95 | Ht 69.0 in | Wt 218.4 lb

## 2018-03-01 DIAGNOSIS — I4811 Longstanding persistent atrial fibrillation: Secondary | ICD-10-CM | POA: Diagnosis not present

## 2018-03-01 DIAGNOSIS — I10 Essential (primary) hypertension: Secondary | ICD-10-CM | POA: Diagnosis not present

## 2018-03-01 DIAGNOSIS — I4891 Unspecified atrial fibrillation: Secondary | ICD-10-CM | POA: Diagnosis not present

## 2018-03-01 DIAGNOSIS — Z5181 Encounter for therapeutic drug level monitoring: Secondary | ICD-10-CM | POA: Diagnosis not present

## 2018-03-01 LAB — POCT INR: INR: 2.5 (ref 2.0–3.0)

## 2018-03-01 NOTE — Patient Instructions (Signed)
Description   Continue warfarin dose to 1.5 tablets daily except 1 tablet each Monday and Friday  Repeat INR in 4 weeks

## 2018-03-01 NOTE — Patient Instructions (Signed)
Follow-Up: You will need a follow up appointment in 6 months.  Please call our office 2 months (June 2020)  in advance to schedule this (AUGUST 2020)  appointment.  You may see Kirk Ruths, MD Jory Sims, DNP, AACC  or one of the following Advanced Practice Providers on your designated Care Team:  Kerin Ransom, [place] Roby Lofts, PA-C  Sande Rives, [place]     Medication Instructions:  NO CHANGES- Your physician recommends that you continue on your current medications as directed. Please refer to the Current Medication list given to you today. If you need a refill on your cardiac medications before your next appointment, please call your pharmacy. Labwork: When you have labs (blood work) and your tests are completely normal, you will receive your results ONLY by [place] (if you have MyChart) -OR- A paper copy in the mail.  At [facility], you and your health needs are our priority.  As part of our continuing mission to provide you with exceptional heart care, we have created designated Provider Care Teams.  These Care Teams include your primary Cardiologist (physician) and Advanced Practice Providers (APPs -  Physician Assistants and Nurse Practitioners) who all work together to provide you with the care you need, when you need it.  Thank you for choosing [facility] at [facility]!!

## 2018-03-29 ENCOUNTER — Ambulatory Visit (INDEPENDENT_AMBULATORY_CARE_PROVIDER_SITE_OTHER): Payer: Federal, State, Local not specified - PPO | Admitting: *Deleted

## 2018-03-29 DIAGNOSIS — Z5181 Encounter for therapeutic drug level monitoring: Secondary | ICD-10-CM | POA: Diagnosis not present

## 2018-03-29 DIAGNOSIS — I4891 Unspecified atrial fibrillation: Secondary | ICD-10-CM | POA: Diagnosis not present

## 2018-03-29 LAB — POCT INR: INR: 2.4 (ref 2.0–3.0)

## 2018-03-29 NOTE — Patient Instructions (Signed)
Description   Continue warfarin dose to 1.5 tablets daily except 1 tablet each Monday and Friday  Repeat INR in 5 weeks

## 2018-04-09 ENCOUNTER — Telehealth: Payer: Self-pay | Admitting: Cardiology

## 2018-04-09 NOTE — Telephone Encounter (Signed)
New Message °

## 2018-04-09 NOTE — Telephone Encounter (Signed)
Ok to hold coumadin and then resume if no further GI bleeding Ronnie Sullivan

## 2018-04-09 NOTE — Telephone Encounter (Signed)
Received a call from [place] with Dr Derinda Late. Patient has GI bleed, Dr Sandi Mariscal consulted with patients GI physician regarding. Dr Sandi Mariscal advised patient to hold his Warfarin for 2 weeks. Will forward to Dr Stanford Breed for review

## 2018-04-09 NOTE — Telephone Encounter (Signed)
Spoke with sherry, Aware of dr Jacalyn Lefevre recommendations. Will make the CVRR clinic aware.

## 2018-04-21 ENCOUNTER — Telehealth: Payer: Self-pay | Admitting: Cardiology

## 2018-04-21 ENCOUNTER — Telehealth: Payer: Self-pay | Admitting: Adult Health

## 2018-04-21 NOTE — Telephone Encounter (Signed)
Dr Stanford Breed is off this pm Dr Deboraha Sprang number is [phone] .Adonis Housekeeper

## 2018-04-21 NOTE — Telephone Encounter (Signed)
Spoke with Judeen Hammans and Dr Derinda Late would like to speak to Ronnie Sims NP re pt  No details given dnd Dr Sandi Mariscal would be available this evening before 5 or tomorrow between 11:00 and 12:00 Will forward to [place] to review .Adonis Housekeeper

## 2018-04-21 NOTE — Telephone Encounter (Addendum)
Received phone call from Dr. Sandi Mariscal [phone]) I called him back from home.   He reports that Mr. Sutch continues to have dark red blood in his stool. He has continued to hold the coumadin, but is worried about his stroke risk. I calculated his  CHADS VASC score 4 from review of our office notes and patient history.    Dr, Sandi Mariscal did  has spoken to Dr.Gessener, GI,  about repeating colonoscopy. Dr. Lorayne Bender felt that with his age, and COVID 33 restrictions,  moving forward with colonoscopy would not be advisable per Dr.Blomgren's report..   Dr. Sandi Mariscal states that the patient is currently stable. He has taken him off of losartan. He requests advisement on restarting coumadin.   I have advised that we hold off on coumadin as benefits outweigh risks in light of active GI bleeding and associated anemia, which is felt to be multifactorial in the setting of CKD.  Shared decision making concerning risks of ongoing bleeding verses CVA has been had by Dr. Sandi Mariscal with the patient and his wife.   I will send this note to Pharmacist and to Dr. Stanford Breed for advisement and any new recommendations. Dr. Sandi Mariscal is faxing the latest labs to our office.

## 2018-04-21 NOTE — Telephone Encounter (Signed)
Will forward to [facility]  PA as Dr Stanford Breed is off this afternoon./cy

## 2018-04-21 NOTE — Telephone Encounter (Signed)
Thanks I will call him

## 2018-04-21 NOTE — Telephone Encounter (Signed)
New Message   Ronnie Sullivan is calling from Dr Arvid Right office and is asking for a PA or Dr Stanford Breed to call and speak with the Doctor  Please call

## 2018-04-21 NOTE — Telephone Encounter (Signed)
Could you please get a phone number on Dr.Blomgren?  I have spoken to him before about manipulating medications on one of the patients. Dr. Stanford Breed can certainly speak to him as well.

## 2018-04-22 NOTE — Telephone Encounter (Signed)
Patient aware to contact coumadin clinic if/when he is restarted on warfarin.  Patient voiced understanding

## 2018-04-22 NOTE — Telephone Encounter (Signed)
Agree with holding coumadin in setting of GI bleed Ronnie Sullivan

## 2018-04-28 NOTE — Telephone Encounter (Signed)
Message addressed see other phone note ./cy

## 2018-05-10 ENCOUNTER — Other Ambulatory Visit: Payer: Self-pay | Admitting: Internal Medicine

## 2018-05-10 DIAGNOSIS — N184 Chronic kidney disease, stage 4 (severe): Secondary | ICD-10-CM

## 2018-05-13 ENCOUNTER — Ambulatory Visit
Admission: RE | Admit: 2018-05-13 | Discharge: 2018-05-13 | Disposition: A | Discharge status: Home / Self Care | Payer: Federal, State, Local not specified - PPO | Source: Ambulatory Visit | Attending: Internal Medicine | Admitting: Internal Medicine

## 2018-05-13 ENCOUNTER — Other Ambulatory Visit: Payer: Self-pay

## 2018-05-13 DIAGNOSIS — N184 Chronic kidney disease, stage 4 (severe): Secondary | ICD-10-CM

## 2018-09-01 ENCOUNTER — Encounter: Payer: Self-pay | Admitting: *Deleted

## 2018-10-04 ENCOUNTER — Telehealth: Payer: Self-pay | Admitting: Pharmacist Clinician (PhC)/ Clinical Pharmacy Specialist

## 2018-10-04 NOTE — Telephone Encounter (Signed)
Patient cleared to re-start warfarin.  Will have him start with last stable dose.  4 mg each Monday and Friday, 6 mg all other days.  Patient agreeable to this plan.  Will do INR in 1 week.

## 2018-10-12 ENCOUNTER — Ambulatory Visit (INDEPENDENT_AMBULATORY_CARE_PROVIDER_SITE_OTHER): Payer: Federal, State, Local not specified - PPO | Admitting: Pharmacist

## 2018-10-12 ENCOUNTER — Other Ambulatory Visit: Payer: Self-pay

## 2018-10-12 DIAGNOSIS — Z5181 Encounter for therapeutic drug level monitoring: Secondary | ICD-10-CM

## 2018-10-12 DIAGNOSIS — I4891 Unspecified atrial fibrillation: Secondary | ICD-10-CM

## 2018-10-12 DIAGNOSIS — Z7901 Long term (current) use of anticoagulants: Secondary | ICD-10-CM

## 2018-10-12 LAB — POCT INR: INR: 1.9 — AB (ref 2.0–3.0)

## 2018-10-18 NOTE — Progress Notes (Signed)
Virtual Visit via Video Note changed to phone visit at patient request.   This visit type was conducted due to national recommendations for restrictions regarding the COVID-19 Pandemic (e.g. social distancing) in an effort to limit this patient's exposure and mitigate transmission in our community.  Due to his co-morbid illnesses, this patient is at least at moderate risk for complications without adequate follow up.  This format is felt to be most appropriate for this patient at this time.  All issues noted in this document were discussed and addressed.  A limited physical exam was performed with this format.  Please refer to the patient's chart for his consent to telehealth for [facility].   Date:  10/19/2018   ID:  Ronnie Sullivan, DOB March 26, 1928, MRN [id number]  Patient Location:Home Provider Location: Home  PCP:  Derinda Late, MD  Cardiologist:  Dr Stanford Breed  Evaluation Performed:  Follow-Up Visit  Chief Complaint:  FU atrial fibrillation  History of Present Illness:    FU hypertension, hyperlipidemia, and atrial fibrillation. Myoview performed on December 23, 2007 showed an ejection fraction 64% with no ischemia or infarction. He has had a previous attempt at outpatient cardioversion. He transiently converted to sinus rhythm, but maintained sinus rhythm only for seconds. We have therefore elected to treat him with rate control and anticoagulation. Holter monitor September 2019 showed atrial fibrillation with PVCs or aberrantly conducted beats rate controlled.  Echocardiogram September 2019 showed normal LV function, mild left ventricular hypertrophy, biatrial enlargement and moderate pulmonary hypertension.  Patient had bronchoscopy October 2019 for hemoptysis but no source identified.  Carotid Dopplers December 2019 showed 40 to 59% right and left stenosis.  Chest CT January 2020 showed 7 mm left lower lobe lung nodule.  Follow-up recommended 1 year.  Since I last saw him,   patient denies dyspnea, chest pain, palpitations, syncope or bleeding.  The patient does not have symptoms concerning for COVID-19 infection (fever, chills, cough, or new shortness of breath).    Past Medical History:  Diagnosis Date  . Aortic insufficiency   . Atrial fibrillation ([place])   . CKD (chronic kidney disease)   . Diverticulosis   . External hemorrhoids   . Glaucoma 2010  . Glucose intolerance (impaired glucose tolerance)   . Hearing loss of both ears 2012  . HTN (hypertension)   . Hyperlipidemia   . Left lumbar radiculopathy 2013  . Macular degeneration 2012   right  . Mitral regurgitation   . Prostate cancer ([place])    XRT  . Tricuspid regurgitation   . Tubular adenoma of colon 11/2004   Past Surgical History:  Procedure Laterality Date  . APPENDECTOMY    . CATARACT EXTRACTION  2008  . COLONOSCOPY  2006  . [place]  . VIDEO BRONCHOSCOPY Bilateral 11/05/2017   Procedure: VIDEO BRONCHOSCOPY WITHOUT FLUORO;  Surgeon: Tanda Rockers, MD;  Location: [place];  Service: Cardiopulmonary;  Laterality: Bilateral;     Current Meds  Medication Sig  . acetaminophen (TYLENOL) 500 MG tablet Take 1,000 mg by mouth 2 (two) times daily.  Marland Kitchen allopurinol (ZYLOPRIM) 300 MG tablet Take 300 mg by mouth daily.  Marland Kitchen atorvastatin (LIPITOR) 40 MG tablet Take 40 mg by mouth daily.  Marland Kitchen latanoprost (XALATAN) 0.005 % ophthalmic solution Place 1 drop into both eyes at bedtime.    Marland Kitchen losartan (COZAAR) 100 MG tablet Take 1 tablet (100 mg total) by mouth daily.  . metoprolol succinate (TOPROL-XL) 100 MG 24 hr  tablet Take one and one half tablets with or immediately following a meal. (Patient taking differently: Take 150 mg by mouth daily. Take one and one half tablets with or immediately following a meal.)  . Multiple Vitamin (MULTIVITAMIN) tablet Take 1 tablet by mouth daily.    Marland Kitchen torsemide (DEMADEX) 20 MG tablet Take 1.5 tablets (30 mg total) by mouth daily.  Marland Kitchen warfarin  (COUMADIN) 4 MG tablet TAKE 1 AND 1/2 TABLETS DAILY AS DIRECTED BY COUMADIN CLINIC     Allergies:   Patient has no known allergies.   Social History   Tobacco Use  . Smoking status: Former Smoker    Packs/day: 1.00    Types: Cigarettes, Pipe    Start date: 11/04/1946    Quit date: 02/05/1969    Years since quitting: 49.7  . Smokeless tobacco: Never Used  Substance Use Topics  . Alcohol use: Yes    Alcohol/week: 5.0 standard drinks    Types: 5 Glasses of wine per week    Comment: every afternoon  . Drug use: No     Family Hx: The patient's family history includes Kidney failure in his mother.  ROS:   Please see the history of present illness.    No Fever, chills  or productive cough; back and leg pain noted All other systems reviewed and are negative.   Wt Readings from Last 3 Encounters:  10/19/18 204 lb (92.5 kg)  03/01/18 218 lb 6.4 oz (99.1 kg)  11/11/17 225 lb (102.1 kg)     Objective:    Vital Signs:  Ht 5\' 9"  (1.753 m)   Wt 204 lb (92.5 kg)   BMI 30.13 kg/m    VITAL SIGNS:  reviewed NAD Answers questions appropriately Normal affect Remainder of physical examination not performed (telehealth visit; coronavirus pandemic)  ASSESSMENT & PLAN:    1. Permanent atrial fibrillation-continue beta-blocker for rate control.  Continue Coumadin.  He previously declined DOACs. 2. Hypertension- Continue present medications and follow. 3. Hyperlipidemia-continue statin. 4. Lower extremity edema-reasonably well controlled.  Continue present dose of diuretic. 5. Lung nodule-follow-up noncontrast chest CT January 2021. 6. Chronic stage III kidney disease-monitored by nephrology. 7. Carotid artery disease-follow-up carotid Dopplers December 2020.  Continue statin.  No aspirin given need for Coumadin.  COVID-19 Education: The importance of social distancing was discussed today.  Time:   Today, I have spent 18 minutes with the patient with telehealth technology  discussing the above problems.     Medication Adjustments/Labs and Tests Ordered: Current medicines are reviewed at length with the patient today.  Concerns regarding medicines are outlined above.   Tests Ordered: No orders of the defined types were placed in this encounter.   Medication Changes: No orders of the defined types were placed in this encounter.   Follow Up:  Virtual Visit or In Person in 1 year(s)  Signed, Kirk Ruths, MD  10/19/2018 9:42 AM    [place]

## 2018-10-19 ENCOUNTER — Other Ambulatory Visit: Payer: Self-pay

## 2018-10-19 ENCOUNTER — Encounter: Payer: Self-pay | Admitting: Cardiology

## 2018-10-19 ENCOUNTER — Ambulatory Visit: Payer: Federal, State, Local not specified - PPO | Admitting: Cardiology

## 2018-10-19 ENCOUNTER — Telehealth (INDEPENDENT_AMBULATORY_CARE_PROVIDER_SITE_OTHER): Payer: Federal, State, Local not specified - PPO | Admitting: Cardiology

## 2018-10-19 ENCOUNTER — Ambulatory Visit (INDEPENDENT_AMBULATORY_CARE_PROVIDER_SITE_OTHER): Payer: Federal, State, Local not specified - PPO | Admitting: Pharmacist Clinician (PhC)/ Clinical Pharmacy Specialist

## 2018-10-19 VITALS — Ht 69.0 in | Wt 204.0 lb

## 2018-10-19 DIAGNOSIS — Z5181 Encounter for therapeutic drug level monitoring: Secondary | ICD-10-CM | POA: Diagnosis not present

## 2018-10-19 DIAGNOSIS — R911 Solitary pulmonary nodule: Secondary | ICD-10-CM

## 2018-10-19 DIAGNOSIS — I6523 Occlusion and stenosis of bilateral carotid arteries: Secondary | ICD-10-CM

## 2018-10-19 DIAGNOSIS — I10 Essential (primary) hypertension: Secondary | ICD-10-CM | POA: Diagnosis not present

## 2018-10-19 DIAGNOSIS — Z7901 Long term (current) use of anticoagulants: Secondary | ICD-10-CM

## 2018-10-19 DIAGNOSIS — I4891 Unspecified atrial fibrillation: Secondary | ICD-10-CM

## 2018-10-19 LAB — POCT INR: INR: 2 (ref 2.0–3.0)

## 2018-10-19 NOTE — Addendum Note (Signed)
Addended by: Cristopher Estimable on: 10/19/2018 10:04 AM   Modules accepted: Orders

## 2018-10-19 NOTE — Patient Instructions (Signed)
Medication Instructions:  NO CHANGE If you need a refill on your cardiac medications before your next appointment, please call your pharmacy.   Lab work: If you have labs (blood work) drawn today and your tests are completely normal, you will receive your results only by: Marland Kitchen MyChart Message (if you have MyChart) OR . A paper copy in the mail If you have any lab test that is abnormal or we need to change your treatment, we will call you to review the results.  Testing/Procedures: Your physician has requested that you have a carotid duplex. This test is an ultrasound of the carotid arteries in your neck. It looks at blood flow through these arteries that supply the brain with blood. Allow one hour for this exam. There are no restrictions or special instructions.DUE IN December-[place] OFFICE  CT SCAN IN January FOR LUNG [place] CALL YOU TO SCHEDULE.    Follow-Up: At [facility], you and your health needs are our priority.  As part of our continuing mission to provide you with exceptional heart care, we have created designated Provider Care Teams.  These Care Teams include your primary Cardiologist (physician) and Advanced Practice Providers (APPs -  Physician Assistants and Nurse Practitioners) who all work together to provide you with the care you need, when you need it. You will need a follow up appointment in 12 months.  Please call our office 2 months in advance to schedule this appointment.  You may see Kirk Ruths, MD or one of the following Advanced Practice Providers on your designated Care Team:   Kerin Ransom, PA-C Roby Lofts, [place] . Sande Rives, PA-C

## 2018-11-01 ENCOUNTER — Other Ambulatory Visit: Payer: Self-pay

## 2018-11-01 ENCOUNTER — Ambulatory Visit (INDEPENDENT_AMBULATORY_CARE_PROVIDER_SITE_OTHER): Payer: Federal, State, Local not specified - PPO | Admitting: Pharmacist Clinician (PhC)/ Clinical Pharmacy Specialist

## 2018-11-01 DIAGNOSIS — I4891 Unspecified atrial fibrillation: Secondary | ICD-10-CM | POA: Diagnosis not present

## 2018-11-01 DIAGNOSIS — Z7901 Long term (current) use of anticoagulants: Secondary | ICD-10-CM | POA: Diagnosis not present

## 2018-11-01 DIAGNOSIS — Z5181 Encounter for therapeutic drug level monitoring: Secondary | ICD-10-CM | POA: Diagnosis not present

## 2018-11-01 LAB — POCT INR: INR: 2 (ref 2.0–3.0)

## 2018-11-01 MED ORDER — WARFARIN SODIUM 4 MG PO TABS: ORAL_TABLET | ORAL | 1 refills | Status: DC

## 2018-11-29 ENCOUNTER — Other Ambulatory Visit: Payer: Self-pay

## 2018-11-29 ENCOUNTER — Ambulatory Visit (INDEPENDENT_AMBULATORY_CARE_PROVIDER_SITE_OTHER): Payer: Federal, State, Local not specified - PPO | Admitting: Pharmacist Clinician (PhC)/ Clinical Pharmacy Specialist

## 2018-11-29 DIAGNOSIS — Z5181 Encounter for therapeutic drug level monitoring: Secondary | ICD-10-CM | POA: Diagnosis not present

## 2018-11-29 DIAGNOSIS — I4891 Unspecified atrial fibrillation: Secondary | ICD-10-CM

## 2018-11-29 DIAGNOSIS — Z7901 Long term (current) use of anticoagulants: Secondary | ICD-10-CM

## 2018-11-29 LAB — POCT INR: INR: 1.8 — AB (ref 2.0–3.0)

## 2018-12-13 ENCOUNTER — Other Ambulatory Visit: Payer: Self-pay | Admitting: Pharmacist

## 2018-12-13 DIAGNOSIS — I4821 Permanent atrial fibrillation: Secondary | ICD-10-CM

## 2018-12-13 MED ORDER — METOPROLOL SUCCINATE ER 100 MG PO TB24: ORAL_TABLET | ORAL | 3 refills | Status: DC

## 2018-12-20 ENCOUNTER — Other Ambulatory Visit: Payer: Self-pay

## 2018-12-20 ENCOUNTER — Ambulatory Visit (INDEPENDENT_AMBULATORY_CARE_PROVIDER_SITE_OTHER): Payer: Federal, State, Local not specified - PPO | Admitting: Pharmacist Clinician (PhC)/ Clinical Pharmacy Specialist

## 2018-12-20 DIAGNOSIS — Z7901 Long term (current) use of anticoagulants: Secondary | ICD-10-CM

## 2018-12-20 DIAGNOSIS — I4891 Unspecified atrial fibrillation: Secondary | ICD-10-CM

## 2018-12-20 DIAGNOSIS — Z5181 Encounter for therapeutic drug level monitoring: Secondary | ICD-10-CM | POA: Diagnosis not present

## 2018-12-20 LAB — POCT INR: INR: 1.8 — AB (ref 2.0–3.0)

## 2018-12-27 ENCOUNTER — Ambulatory Visit (HOSPITAL_COMMUNITY)
Admission: RE | Admit: 2018-12-27 | Discharge: 2018-12-27 | Disposition: A | Discharge status: Home / Self Care | Payer: Federal, State, Local not specified - PPO | Source: Ambulatory Visit | Attending: Family | Admitting: Family

## 2018-12-27 ENCOUNTER — Other Ambulatory Visit: Payer: Self-pay

## 2018-12-27 ENCOUNTER — Other Ambulatory Visit (HOSPITAL_COMMUNITY): Payer: Self-pay | Admitting: Family Medicine

## 2018-12-27 DIAGNOSIS — I779 Disorder of arteries and arterioles, unspecified: Secondary | ICD-10-CM | POA: Insufficient documentation

## 2018-12-29 ENCOUNTER — Encounter: Payer: Self-pay | Admitting: *Deleted

## 2019-01-03 ENCOUNTER — Ambulatory Visit (INDEPENDENT_AMBULATORY_CARE_PROVIDER_SITE_OTHER): Payer: Federal, State, Local not specified - PPO | Admitting: Pharmacist

## 2019-01-03 ENCOUNTER — Other Ambulatory Visit: Payer: Self-pay

## 2019-01-03 DIAGNOSIS — Z7901 Long term (current) use of anticoagulants: Secondary | ICD-10-CM | POA: Diagnosis not present

## 2019-01-03 DIAGNOSIS — Z5181 Encounter for therapeutic drug level monitoring: Secondary | ICD-10-CM | POA: Diagnosis not present

## 2019-01-03 DIAGNOSIS — I4891 Unspecified atrial fibrillation: Secondary | ICD-10-CM

## 2019-01-03 LAB — POCT INR: INR: 2.3 (ref 2.0–3.0)

## 2019-02-04 ENCOUNTER — Encounter: Payer: Self-pay | Admitting: Pharmacist

## 2019-02-04 ENCOUNTER — Other Ambulatory Visit: Payer: Self-pay

## 2019-02-04 ENCOUNTER — Ambulatory Visit (INDEPENDENT_AMBULATORY_CARE_PROVIDER_SITE_OTHER): Payer: Federal, State, Local not specified - PPO | Admitting: Pharmacist

## 2019-02-04 DIAGNOSIS — Z5181 Encounter for therapeutic drug level monitoring: Secondary | ICD-10-CM

## 2019-02-04 DIAGNOSIS — I4891 Unspecified atrial fibrillation: Secondary | ICD-10-CM

## 2019-02-04 LAB — POCT INR: INR: 2.4 (ref 2.0–3.0)

## 2019-02-10 ENCOUNTER — Encounter: Payer: Self-pay | Admitting: *Deleted

## 2019-02-22 ENCOUNTER — Telehealth: Payer: Self-pay | Admitting: *Deleted

## 2019-02-22 NOTE — Telephone Encounter (Signed)
Spoke with pt, it is time for him to have a CT scan to follow up his lung nodule. He is not interested in having testing done at this time and will call back to schedule.

## 2019-03-07 ENCOUNTER — Ambulatory Visit: Payer: Federal, State, Local not specified - PPO | Attending: Internal Medicine

## 2019-03-07 DIAGNOSIS — Z23 Encounter for immunization: Secondary | ICD-10-CM | POA: Insufficient documentation

## 2019-03-07 NOTE — Progress Notes (Signed)
   Covid-19 Vaccination Clinic  Name:  Ronnie Sullivan    MRN: [id number] DOB: 03-31-1928  03/07/2019  [name] was observed post Covid-19 immunization for 15 minutes without incidence. He was provided with Vaccine Information Sheet and instruction to access the V-Safe system.   [name] was instructed to call 911 with any severe reactions post vaccine: Marland Kitchen Difficulty breathing  . Swelling of your face and throat  . A fast heartbeat  . A bad rash all over your body  . Dizziness and weakness    Immunizations Administered    Name Date Dose VIS Date Route   Moderna COVID-19 Vaccine 03/07/2019  9:57 AM 0.5 mL 12/21/2018 Intramuscular   Manufacturer: Moderna   Lot: [id number]   [place]: [phone]

## 2019-03-18 ENCOUNTER — Other Ambulatory Visit: Payer: Self-pay

## 2019-03-18 ENCOUNTER — Ambulatory Visit (INDEPENDENT_AMBULATORY_CARE_PROVIDER_SITE_OTHER): Payer: Federal, State, Local not specified - PPO | Admitting: Pharmacist

## 2019-03-18 DIAGNOSIS — I4891 Unspecified atrial fibrillation: Secondary | ICD-10-CM | POA: Diagnosis not present

## 2019-03-18 DIAGNOSIS — Z7901 Long term (current) use of anticoagulants: Secondary | ICD-10-CM | POA: Diagnosis not present

## 2019-03-18 LAB — POCT INR: INR: 1.9 — AB (ref 2.0–3.0)

## 2019-03-28 IMAGING — CT CT CHEST W/O CM
1 series · 14 of 34 positions shown, 18 images · non-contrast
Comparison: 10/21/2017 chest radiograph.

CLINICAL DATA: Hemoptysis and dyspnea. Remote smoking history.
History of prostate cancer.

EXAM:
CT CHEST WITHOUT CONTRAST
TECHNIQUE: Multidetector CT imaging of the chest was performed following the
standard protocol without IV contrast.

[Series 2: chest w/(date) · axial · 0.88mm/px · z∈[-352,-42]mm · 14 of 183 slices shown, 18 images]
[im 14/183  mediastinal]
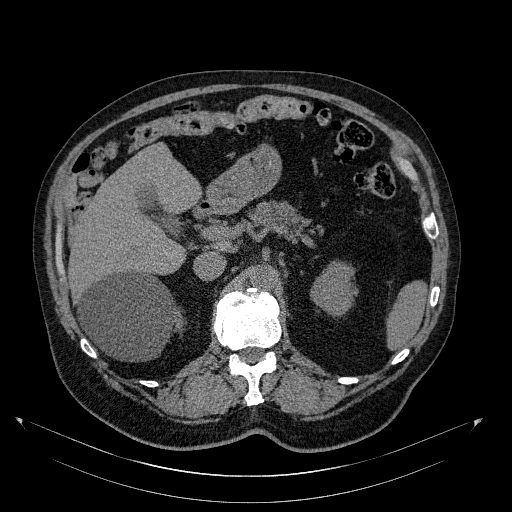
[im 14/183  lung]
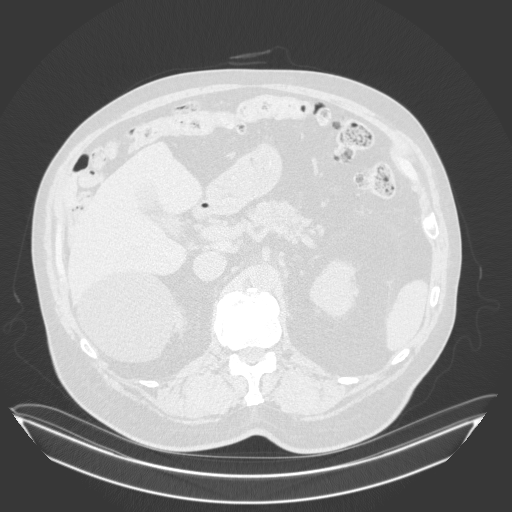
[im 27/183  lung]
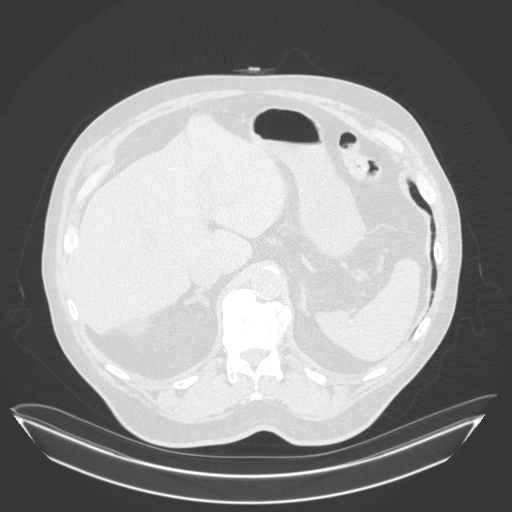
[im 37/183  lung]
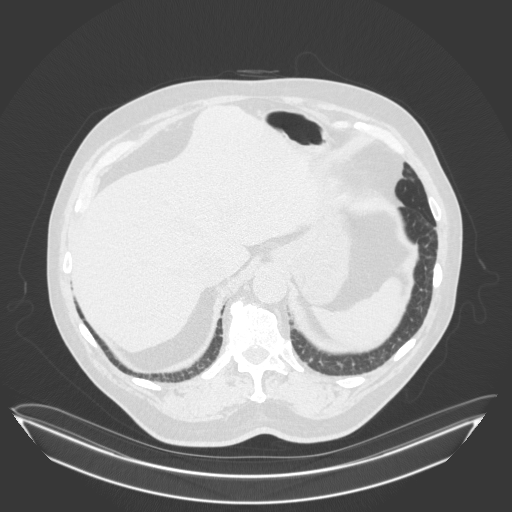
[im 54/183  lung]
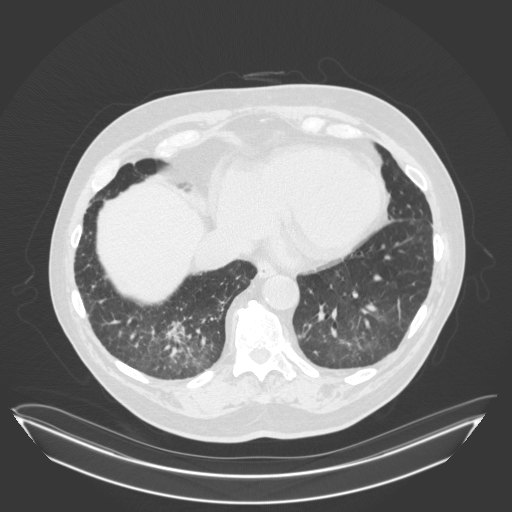
[im 68/183  mediastinal]
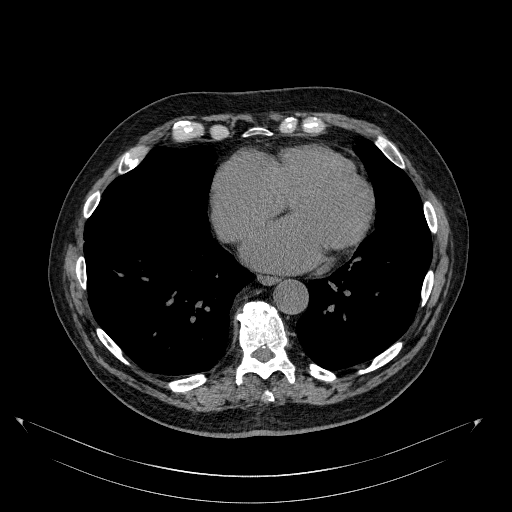
[im 68/183  lung]
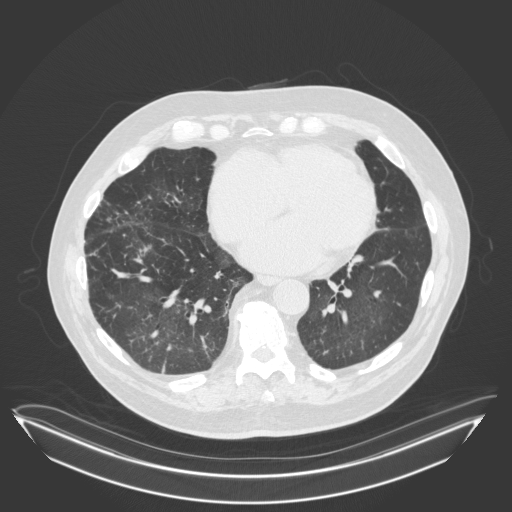
[im 75/183  lung]
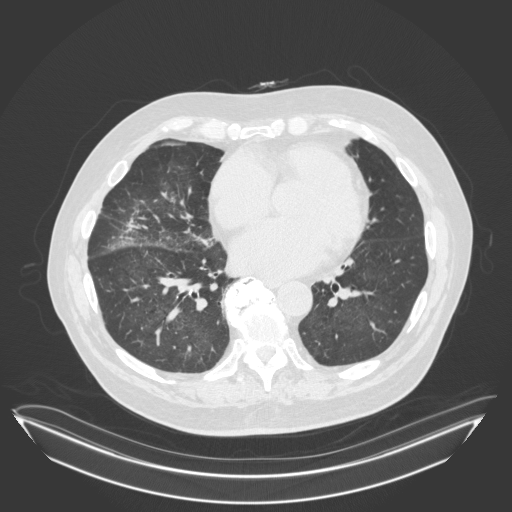
[im 87/183  lung]
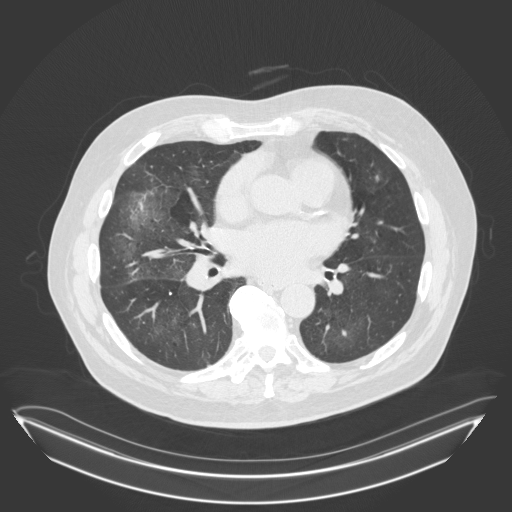
[im 97/183  lung]
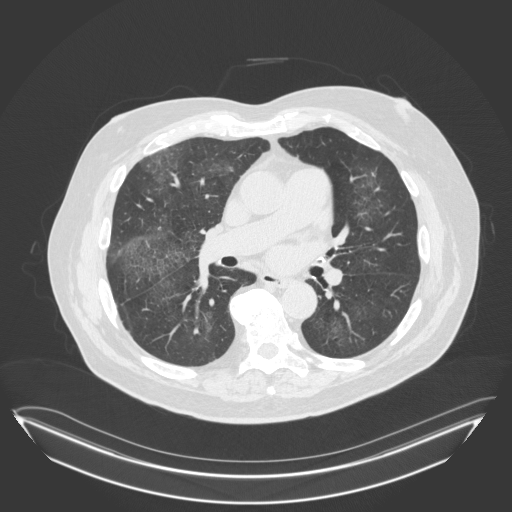
[im 108/183  mediastinal]
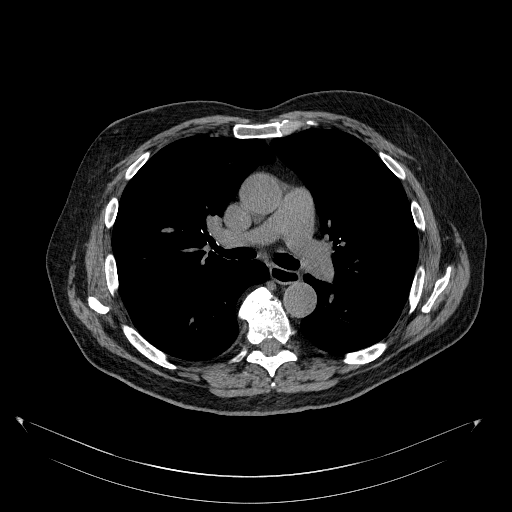
[im 108/183  lung]
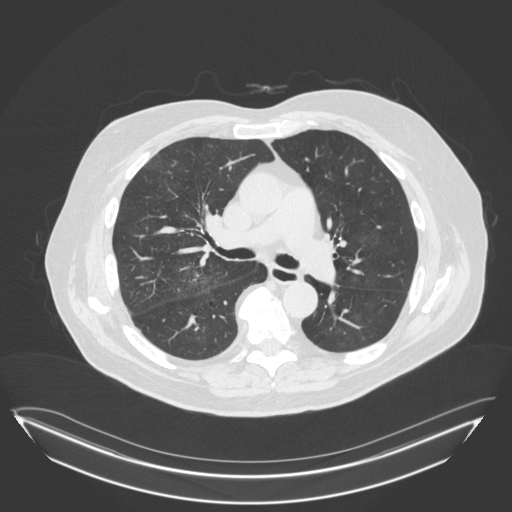
[im 115/183  lung]
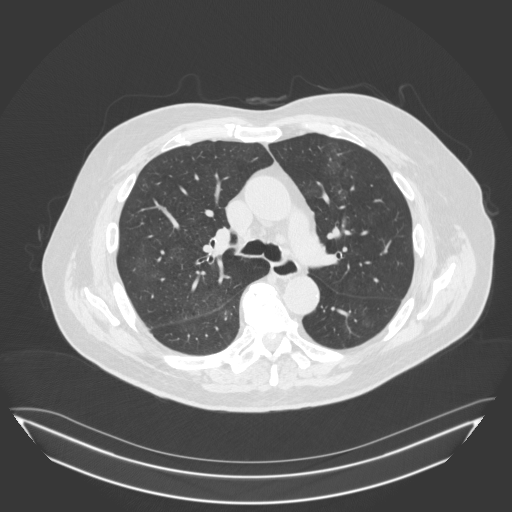
[im 135/183  lung]
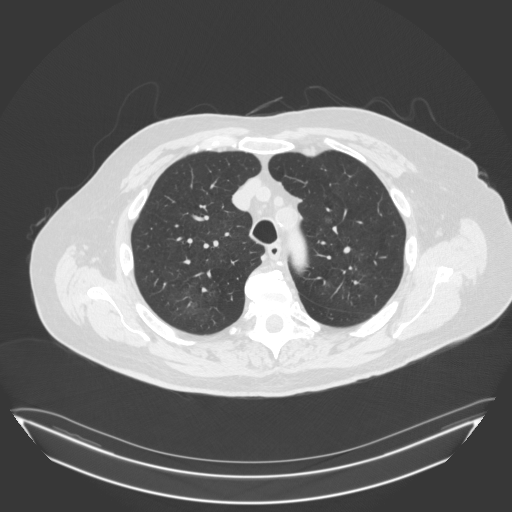
[im 146/183  lung]
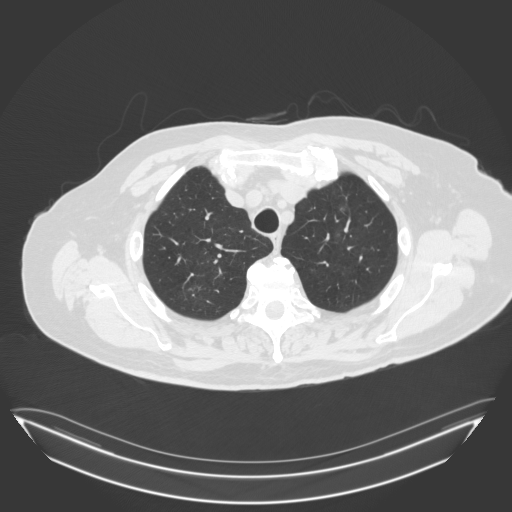
[im 156/183  mediastinal]
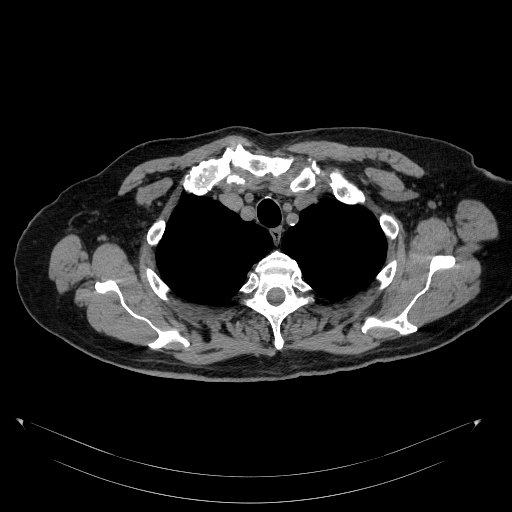
[im 156/183  lung]
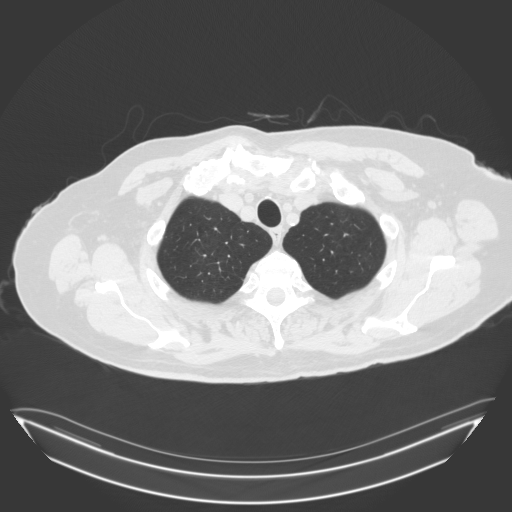
[im 169/183  lung]
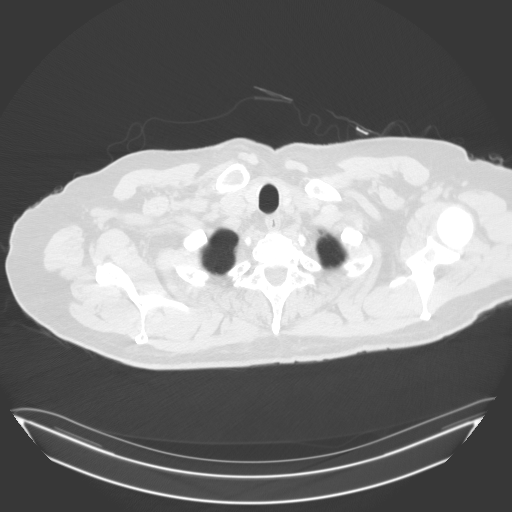

[14 of 34 positions shown; findings below may reference images not displayed]

FINDINGS: Cardiovascular: Borderline mild cardiomegaly. No significant
pericardial effusion/thickening. Left anterior descending and left
circumflex coronary atherosclerosis. Atherosclerotic nonaneurysmal
thoracic aorta. Dilated main pulmonary artery (3.5 cm diameter).

Mediastinum/Nodes: No discrete thyroid nodules. Unremarkable
esophagus. No pathologically enlarged axillary, mediastinal or hilar
lymph nodes, noting limited sensitivity for the detection of hilar
adenopathy on this noncontrast study.

Lungs/Pleura: No pneumothorax. No pleural effusion. Mild
centrilobular emphysema. There is patchy ground-glass opacity
throughout both lungs involving all lung lobes, most prominent in
the mid to lower lungs. There is mild interlobular septal thickening
in both lungs, predominantly in the lower lungs. There is minimal
patchy peribronchovascular consolidation in the peripheral right
middle lobe and dependent lower lobes. No lung masses. A few
scattered solid pulmonary nodules in both lungs, largest 7 mm in the
superior segment left lower lobe (series 3/image 63). No significant
bronchiectasis.

Upper abdomen: Cholelithiasis. Partially visualized simple appearing
bilateral renal cysts, largest 8.5 cm in the lateral upper right
kidney. Colonic diverticulosis.

Musculoskeletal: No aggressive appearing focal osseous lesions.
Marked thoracic spondylosis.
IMPRESSION: 1. Patchy ground-glass opacity throughout both lungs, most prominent
in the mid to lower lungs. Minimal patchy peribronchovascular
consolidation in the right middle and lower lobes. Mild interlobular
septal thickening in both lungs. Findings are nonspecific with a
broad differential including mild cardiogenic pulmonary edema (given
the borderline mild cardiomegaly), diffuse alveolar hemorrhage, drug
toxicity or multilobar infection.
2. Scattered solid pulmonary nodules in both lungs, largest 7 mm.
Given the history of prostate cancer and the absence of prior
comparison chest CT, close chest CT follow-up is suggested in 3
months.
3. Dilated main pulmonary artery, suggesting pulmonary arterial
hypertension.
4. Two vessel coronary atherosclerosis.
5. Cholelithiasis.

Aortic Atherosclerosis (DD9IS-XJT.T) and Emphysema (DD9IS-5EO.8).

## 2019-04-05 ENCOUNTER — Ambulatory Visit: Payer: Federal, State, Local not specified - PPO | Attending: Internal Medicine

## 2019-04-05 DIAGNOSIS — Z23 Encounter for immunization: Secondary | ICD-10-CM

## 2019-04-05 NOTE — Progress Notes (Signed)
   Covid-19 Vaccination Clinic  Name:  Ronnie Sullivan    MRN: [id number] DOB: 03-19-1928  04/05/2019  Ronnie Sullivan was observed post Covid-19 immunization for 15 minutes without incident. He was provided with Vaccine Information Sheet and instruction to access the V-Safe system.   Ronnie Sullivan was instructed to call 911 with any severe reactions post vaccine: Marland Kitchen Difficulty breathing  . Swelling of face and throat  . A fast heartbeat  . A bad rash all over body  . Dizziness and weakness   Immunizations Administered    Name Date Dose VIS Date Route   Moderna COVID-19 Vaccine 04/05/2019  9:53 AM 0.5 mL 12/21/2018 Intramuscular   Manufacturer: Moderna   Lot: [id number]   [place]: [id number]

## 2019-04-15 ENCOUNTER — Ambulatory Visit (INDEPENDENT_AMBULATORY_CARE_PROVIDER_SITE_OTHER): Payer: Federal, State, Local not specified - PPO | Admitting: Pharmacist Clinician (PhC)/ Clinical Pharmacy Specialist

## 2019-04-15 ENCOUNTER — Other Ambulatory Visit: Payer: Self-pay

## 2019-04-15 DIAGNOSIS — Z7901 Long term (current) use of anticoagulants: Secondary | ICD-10-CM

## 2019-04-15 DIAGNOSIS — I4891 Unspecified atrial fibrillation: Secondary | ICD-10-CM

## 2019-04-15 LAB — POCT INR: INR: 1.7 — AB (ref 2.0–3.0)

## 2019-04-25 ENCOUNTER — Other Ambulatory Visit: Payer: Self-pay | Admitting: Cardiology

## 2019-04-29 ENCOUNTER — Other Ambulatory Visit: Payer: Self-pay

## 2019-04-29 ENCOUNTER — Ambulatory Visit (INDEPENDENT_AMBULATORY_CARE_PROVIDER_SITE_OTHER): Payer: Federal, State, Local not specified - PPO | Admitting: Pharmacist

## 2019-04-29 DIAGNOSIS — I4891 Unspecified atrial fibrillation: Secondary | ICD-10-CM | POA: Diagnosis not present

## 2019-04-29 DIAGNOSIS — Z7901 Long term (current) use of anticoagulants: Secondary | ICD-10-CM | POA: Diagnosis not present

## 2019-04-29 LAB — POCT INR: INR: 1.8 — AB (ref 2.0–3.0)

## 2019-05-16 ENCOUNTER — Other Ambulatory Visit: Payer: Self-pay

## 2019-05-16 ENCOUNTER — Ambulatory Visit (INDEPENDENT_AMBULATORY_CARE_PROVIDER_SITE_OTHER): Payer: Federal, State, Local not specified - PPO | Admitting: Pharmacist Clinician (PhC)/ Clinical Pharmacy Specialist

## 2019-05-16 DIAGNOSIS — Z7901 Long term (current) use of anticoagulants: Secondary | ICD-10-CM | POA: Diagnosis not present

## 2019-05-16 DIAGNOSIS — I4891 Unspecified atrial fibrillation: Secondary | ICD-10-CM

## 2019-05-16 LAB — POCT INR: INR: 2.5 (ref 2.0–3.0)

## 2019-06-13 ENCOUNTER — Other Ambulatory Visit: Payer: Self-pay

## 2019-06-13 ENCOUNTER — Ambulatory Visit (INDEPENDENT_AMBULATORY_CARE_PROVIDER_SITE_OTHER): Payer: Federal, State, Local not specified - PPO | Admitting: Pharmacist

## 2019-06-13 DIAGNOSIS — I4891 Unspecified atrial fibrillation: Secondary | ICD-10-CM

## 2019-06-13 DIAGNOSIS — Z7901 Long term (current) use of anticoagulants: Secondary | ICD-10-CM | POA: Diagnosis not present

## 2019-06-13 LAB — POCT INR: INR: 3.2 — AB (ref 2.0–3.0)

## 2019-07-11 ENCOUNTER — Ambulatory Visit (INDEPENDENT_AMBULATORY_CARE_PROVIDER_SITE_OTHER): Payer: Federal, State, Local not specified - PPO | Admitting: Pharmacist Clinician (PhC)/ Clinical Pharmacy Specialist

## 2019-07-11 ENCOUNTER — Other Ambulatory Visit: Payer: Self-pay

## 2019-07-11 DIAGNOSIS — I4891 Unspecified atrial fibrillation: Secondary | ICD-10-CM

## 2019-07-11 DIAGNOSIS — Z7901 Long term (current) use of anticoagulants: Secondary | ICD-10-CM

## 2019-07-11 LAB — POCT INR: INR: 2.8 (ref 2.0–3.0)

## 2019-08-22 ENCOUNTER — Ambulatory Visit (INDEPENDENT_AMBULATORY_CARE_PROVIDER_SITE_OTHER): Payer: Federal, State, Local not specified - PPO

## 2019-08-22 ENCOUNTER — Other Ambulatory Visit: Payer: Self-pay

## 2019-08-22 DIAGNOSIS — Z7901 Long term (current) use of anticoagulants: Secondary | ICD-10-CM | POA: Diagnosis not present

## 2019-08-22 LAB — POCT INR: INR: 2.8 (ref 2.0–3.0)

## 2019-08-22 NOTE — Patient Instructions (Signed)
Continue taking 1.5 tablets daily.  Repeat INR in 6 weeks

## 2019-09-22 ENCOUNTER — Ambulatory Visit: Payer: Federal, State, Local not specified - PPO | Admitting: Cardiology

## 2019-10-03 ENCOUNTER — Other Ambulatory Visit: Payer: Self-pay

## 2019-10-03 ENCOUNTER — Ambulatory Visit (INDEPENDENT_AMBULATORY_CARE_PROVIDER_SITE_OTHER): Payer: Federal, State, Local not specified - PPO

## 2019-10-03 DIAGNOSIS — Z7901 Long term (current) use of anticoagulants: Secondary | ICD-10-CM

## 2019-10-03 LAB — POCT INR: INR: 3.3 — AB (ref 2.0–3.0)

## 2019-10-03 NOTE — Patient Instructions (Signed)
Take 0.5 tablet today and then Continue taking 1.5 tablets daily.  Repeat INR in 6 weeks

## 2019-11-07 ENCOUNTER — Ambulatory Visit: Payer: Federal, State, Local not specified - PPO | Admitting: Cardiology

## 2019-11-14 ENCOUNTER — Other Ambulatory Visit: Payer: Self-pay

## 2019-11-14 ENCOUNTER — Ambulatory Visit (INDEPENDENT_AMBULATORY_CARE_PROVIDER_SITE_OTHER): Payer: Federal, State, Local not specified - PPO | Admitting: Pharmacist

## 2019-11-14 DIAGNOSIS — Z7901 Long term (current) use of anticoagulants: Secondary | ICD-10-CM

## 2019-11-14 LAB — POCT INR: INR: 3.2 — AB (ref 2.0–3.0)

## 2019-11-14 MED ORDER — WARFARIN SODIUM 4 MG PO TABS: ORAL_TABLET | ORAL | 1 refills | Status: DC

## 2019-12-04 ENCOUNTER — Other Ambulatory Visit: Payer: Self-pay | Admitting: Cardiology

## 2019-12-04 DIAGNOSIS — I4821 Permanent atrial fibrillation: Secondary | ICD-10-CM

## 2019-12-12 ENCOUNTER — Ambulatory Visit (INDEPENDENT_AMBULATORY_CARE_PROVIDER_SITE_OTHER): Payer: Federal, State, Local not specified - PPO

## 2019-12-12 ENCOUNTER — Other Ambulatory Visit: Payer: Self-pay

## 2019-12-12 DIAGNOSIS — Z7901 Long term (current) use of anticoagulants: Secondary | ICD-10-CM

## 2019-12-12 LAB — POCT INR: INR: 3.3 — AB (ref 2.0–3.0)

## 2019-12-12 NOTE — Patient Instructions (Signed)
Hold today only and then continue  1.5 tablets daily except for 1 tablet every Mondays.  Repeat INR in 4 weeks (okay to f/u in 6 weeks if stable)

## 2020-01-09 ENCOUNTER — Other Ambulatory Visit: Payer: Self-pay

## 2020-01-09 ENCOUNTER — Ambulatory Visit (INDEPENDENT_AMBULATORY_CARE_PROVIDER_SITE_OTHER): Payer: Federal, State, Local not specified - PPO | Admitting: Pharmacist Clinician (PhC)/ Clinical Pharmacy Specialist

## 2020-01-09 DIAGNOSIS — I4891 Unspecified atrial fibrillation: Secondary | ICD-10-CM

## 2020-01-09 DIAGNOSIS — Z7901 Long term (current) use of anticoagulants: Secondary | ICD-10-CM

## 2020-01-09 LAB — POCT INR: INR: 3.1 — AB (ref 2.0–3.0)

## 2020-01-18 ENCOUNTER — Encounter: Payer: Self-pay | Admitting: *Deleted

## 2020-01-31 NOTE — Progress Notes (Deleted)
HPI: FU hypertension, hyperlipidemia, and atrial fibrillation. Myoview performed on December 23, 2007 showed an ejection fraction 64% with no ischemia or infarction. He has had a previous attempt at outpatient cardioversion. He transiently converted to sinus rhythm, but maintained sinus rhythm only for seconds. We have therefore elected to treat him with rate control and anticoagulation. Holter monitor September 2019 showed atrial fibrillation with PVCs or aberrantly conducted beats rate controlled.  Echocardiogram September 2019 showed normal LV function, mild left ventricular hypertrophy, biatrial enlargement and moderate pulmonary hypertension.  Patient had bronchoscopy October 2019 for hemoptysis but no source identified. Chest CT January 2020 showed 7 mm left lower lobe lung nodule.  Follow-up recommended 1 year.  Carotid Dopplers December 2020 showed 1 to 39% bilateral stenosis. Since I last saw him,  Current Outpatient Medications  Medication Sig Dispense Refill  . acetaminophen (TYLENOL) 500 MG tablet Take 1,000 mg by mouth 2 (two) times daily.    Marland Kitchen allopurinol (ZYLOPRIM) 300 MG tablet Take 300 mg by mouth daily.    Marland Kitchen atorvastatin (LIPITOR) 40 MG tablet Take 40 mg by mouth daily.    Marland Kitchen latanoprost (XALATAN) 0.005 % ophthalmic solution Place 1 drop into both eyes at bedtime.      Marland Kitchen losartan (COZAAR) 100 MG tablet Take 1 tablet (100 mg total) by mouth daily. 90 tablet 4  . metoprolol succinate (TOPROL-XL) 100 MG 24 hr tablet TAKE ONE AND ONE HALF TABLETS WITH OR IMMEDIATELY FOLLOWING A MEAL. 135 tablet 3  . Multiple Vitamin (MULTIVITAMIN) tablet Take 1 tablet by mouth daily.      Marland Kitchen torsemide (DEMADEX) 20 MG tablet Take 1.5 tablets (30 mg total) by mouth daily. 30 tablet 12  . warfarin (COUMADIN) 4 MG tablet Take 1 to 2 tablets daily as directed by the coumadin clinic 135 tablet 1   No current facility-administered medications for this visit.     Past Medical History:  Diagnosis Date   . Aortic insufficiency   . Atrial fibrillation ([place])   . CKD (chronic kidney disease)   . Diverticulosis   . External hemorrhoids   . Glaucoma 2010  . Glucose intolerance (impaired glucose tolerance)   . Hearing loss of both ears 2012  . HTN (hypertension)   . Hyperlipidemia   . Left lumbar radiculopathy 2013  . Macular degeneration 2012   right  . Mitral regurgitation   . Prostate cancer ([place])    XRT  . Tricuspid regurgitation   . Tubular adenoma of colon 11/2004    Past Surgical History:  Procedure Laterality Date  . APPENDECTOMY    . CATARACT EXTRACTION  2008  . COLONOSCOPY  2006  . [place]  . VIDEO BRONCHOSCOPY Bilateral 11/05/2017   Procedure: VIDEO BRONCHOSCOPY WITHOUT FLUORO;  Surgeon: Tanda Rockers, MD;  Location: [facility];  Service: Cardiopulmonary;  Laterality: Bilateral;    Social History   Socioeconomic History  . Marital status: Married    Spouse name: Not on file  . Number of children: Not on file  . Years of education: Not on file  . Highest education level: Not on file  Occupational History  . Occupation: retired  Tobacco Use  . Smoking status: Former Smoker    Packs/day: 1.00    Types: Cigarettes, Pipe    Start date: 11/04/1946    Quit date: 02/05/1969    Years since quitting: 51.0  . Smokeless tobacco: Never Used  Substance and Sexual Activity  .  Alcohol use: Yes    Alcohol/week: 5.0 standard drinks    Types: 5 Glasses of wine per week    Comment: every afternoon  . Drug use: No  . Sexual activity: Not on file  Other Topics Concern  . Not on file  Social History Narrative  . Not on file   Social Determinants of Health   Financial Resource Strain: Not on file  Food Insecurity: Not on file  Transportation Needs: Not on file  Physical Activity: Not on file  Stress: Not on file  Social Connections: Not on file  Intimate Partner Violence: Not on file    Family History  Problem Relation Age of Onset  .  Kidney failure Mother     ROS: no fevers or chills, productive cough, hemoptysis, dysphasia, odynophagia, melena, hematochezia, dysuria, hematuria, rash, seizure activity, orthopnea, PND, pedal edema, claudication. Remaining systems are negative.  Physical Exam: Well-developed well-nourished in no acute distress.  Skin is warm and dry.  HEENT is normal.  Neck is supple.  Chest is clear to auscultation with normal expansion.  Cardiovascular exam is regular rate and rhythm.  Abdominal exam nontender or distended. No masses palpated. Extremities show no edema. neuro grossly intact  ECG- personally reviewed  A/P  1 permanent atrial fibrillation-continue beta-blocker at present dose.  Continue Coumadin with goal INR 2-3.  He has declined DOAC's previously.  2 hypertension-patient's blood pressures controlled.  Continue present medications and follow.  3 hyperlipidemia-continue statin.  4 carotid artery disease-most recent study showed 1 to 39% bilateral stenosis.  5 history of lung nodule-we will arrange follow-up noncontrast chest CT.  6 lower extremity edema-continue diuretic at present dose.  Check potassium and renal function.  Kirk Ruths, MD

## 2020-02-10 ENCOUNTER — Ambulatory Visit: Payer: Federal, State, Local not specified - PPO | Admitting: Cardiology

## 2020-02-15 ENCOUNTER — Ambulatory Visit (INDEPENDENT_AMBULATORY_CARE_PROVIDER_SITE_OTHER): Payer: Federal, State, Local not specified - PPO

## 2020-02-15 ENCOUNTER — Other Ambulatory Visit: Payer: Self-pay

## 2020-02-15 DIAGNOSIS — Z7901 Long term (current) use of anticoagulants: Secondary | ICD-10-CM | POA: Diagnosis not present

## 2020-02-15 LAB — POCT INR: INR: 2.6 (ref 2.0–3.0)

## 2020-02-15 NOTE — Patient Instructions (Signed)
Continue taking 1.5 tablets daily except for 1 tablet every Monday and Friday.  Repeat INR in 6 weeks

## 2020-03-28 ENCOUNTER — Other Ambulatory Visit: Payer: Self-pay

## 2020-03-28 ENCOUNTER — Ambulatory Visit (INDEPENDENT_AMBULATORY_CARE_PROVIDER_SITE_OTHER): Payer: Federal, State, Local not specified - PPO

## 2020-03-28 DIAGNOSIS — Z7901 Long term (current) use of anticoagulants: Secondary | ICD-10-CM

## 2020-03-28 LAB — POCT INR: INR: 2.6 (ref 2.0–3.0)

## 2020-03-28 NOTE — Patient Instructions (Signed)
Continue taking 1.5 tablets daily except for 1 tablet every Monday and Friday.  Repeat INR in 6 weeks

## 2020-04-19 NOTE — Progress Notes (Signed)
HPI: FU hypertension, hyperlipidemia, and atrial fibrillation. Myoview performed on December 23, 2007 showed an ejection fraction 64% with no ischemia or infarction. He has had a previous attempt at outpatient cardioversion. He transiently converted to sinus rhythm, but maintained sinus rhythm only for seconds. We have therefore elected to treat him with rate control and anticoagulation. Holter monitor September 2019 showed atrial fibrillation with PVCs or aberrantly conducted beats rate controlled.  Echocardiogram September 2019 showed normal LV function, mild left ventricular hypertrophy, biatrial enlargement and moderate pulmonary hypertension. Chest CT January 2020 showed 7 mm left lower lobe lung nodule.  Follow-up recommended 1 year. Carotid Dopplers December 2020 showed 1 to 39% bilateral stenosis.  Since I last saw him,he denies dyspnea, chest pain, palpitations, syncope or bleeding.  Chronic mild pedal edema.  Current Outpatient Medications  Medication Sig Dispense Refill  . acetaminophen (TYLENOL) 500 MG tablet Take 1,000 mg by mouth 2 (two) times daily.    Marland Kitchen allopurinol (ZYLOPRIM) 300 MG tablet Take 300 mg by mouth daily.    Marland Kitchen atorvastatin (LIPITOR) 40 MG tablet Take 40 mg by mouth daily.    Marland Kitchen latanoprost (XALATAN) 0.005 % ophthalmic solution Place 1 drop into both eyes at bedtime.      Marland Kitchen losartan (COZAAR) 100 MG tablet Take 1 tablet (100 mg total) by mouth daily. 90 tablet 4  . metoprolol succinate (TOPROL-XL) 100 MG 24 hr tablet TAKE ONE AND ONE HALF TABLETS WITH OR IMMEDIATELY FOLLOWING A MEAL. 135 tablet 3  . Multiple Vitamin (MULTIVITAMIN) tablet Take 1 tablet by mouth daily.      Marland Kitchen torsemide (DEMADEX) 20 MG tablet Take 1.5 tablets (30 mg total) by mouth daily. 30 tablet 12  . warfarin (COUMADIN) 4 MG tablet Take 1 to 2 tablets daily as directed by the coumadin clinic 135 tablet 1   No current facility-administered medications for this visit.     Past Medical History:   Diagnosis Date  . Aortic insufficiency   . Atrial fibrillation ([place])   . CKD (chronic kidney disease)   . Diverticulosis   . External hemorrhoids   . Glaucoma 2010  . Glucose intolerance (impaired glucose tolerance)   . Hearing loss of both ears 2012  . HTN (hypertension)   . Hyperlipidemia   . Left lumbar radiculopathy 2013  . Macular degeneration 2012   right  . Mitral regurgitation   . Prostate cancer ([place])    XRT  . Tricuspid regurgitation   . Tubular adenoma of colon 11/2004    Past Surgical History:  Procedure Laterality Date  . APPENDECTOMY    . CATARACT EXTRACTION  2008  . COLONOSCOPY  2006  . [place]  . VIDEO BRONCHOSCOPY Bilateral 11/05/2017   Procedure: VIDEO BRONCHOSCOPY WITHOUT FLUORO;  Surgeon: Tanda Rockers, MD;  Location: [facility];  Service: Cardiopulmonary;  Laterality: Bilateral;    Social History   Socioeconomic History  . Marital status: Married    Spouse name: Not on file  . Number of children: Not on file  . Years of education: Not on file  . Highest education level: Not on file  Occupational History  . Occupation: retired  Tobacco Use  . Smoking status: Former Smoker    Packs/day: 1.00    Types: Cigarettes, Pipe    Start date: 11/04/1946    Quit date: 02/05/1969    Years since quitting: 51.2  . Smokeless tobacco: Never Used  Substance and Sexual Activity  .  Alcohol use: Yes    Alcohol/week: 5.0 standard drinks    Types: 5 Glasses of wine per week    Comment: every afternoon  . Drug use: No  . Sexual activity: Not on file  Other Topics Concern  . Not on file  Social History Narrative  . Not on file   Social Determinants of Health   Financial Resource Strain: Not on file  Food Insecurity: Not on file  Transportation Needs: Not on file  Physical Activity: Not on file  Stress: Not on file  Social Connections: Not on file  Intimate Partner Violence: Not on file    Family History  Problem Relation  Age of Onset  . Kidney failure Mother     ROS: no fevers or chills, productive cough, hemoptysis, dysphasia, odynophagia, melena, hematochezia, dysuria, hematuria, rash, seizure activity, orthopnea, PND, claudication. Remaining systems are negative.  Physical Exam: Well-developed well-nourished in no acute distress.  Skin is warm and dry.  HEENT is normal.  Neck is supple.  Chest is clear to auscultation with normal expansion.  Cardiovascular exam is irregular Abdominal exam nontender or distended. No masses palpated. Extremities show trace edema. neuro grossly intact  ECG-atrial fibrillation at a rate of 80, cannot rule out septal infarct.  Personally reviewed  A/P  1 permanent atrial fibrillation-plan to continue beta-blocker for rate control.  Continue Coumadin.    2 hypertension-blood pressure controlled.  Continue present medications and follow.  3 hyperlipidemia-continue statin.    4 history of lung nodule-arrange follow-up noncontrast chest CT.  5 lower extremity edema-continue diuretic at present dose.    6 Carotid artery disease-minimal on most recent Dopplers.  7 chronic stage III kidney disease-Per nephrology.  We will have most recent laboratories including CBC, bmet, lipids and liver forwarded to [place] from primary care.  Kirk Ruths, MD

## 2020-04-24 ENCOUNTER — Ambulatory Visit: Payer: Federal, State, Local not specified - PPO | Admitting: Cardiology

## 2020-04-26 ENCOUNTER — Ambulatory Visit (INDEPENDENT_AMBULATORY_CARE_PROVIDER_SITE_OTHER): Payer: Federal, State, Local not specified - PPO | Admitting: Cardiology

## 2020-04-26 ENCOUNTER — Other Ambulatory Visit: Payer: Self-pay

## 2020-04-26 ENCOUNTER — Encounter: Payer: Self-pay | Admitting: Cardiology

## 2020-04-26 VITALS — BP 138/80 | HR 80 | Ht 69.0 in | Wt 220.6 lb

## 2020-04-26 DIAGNOSIS — R911 Solitary pulmonary nodule: Secondary | ICD-10-CM

## 2020-04-26 DIAGNOSIS — I10 Essential (primary) hypertension: Secondary | ICD-10-CM

## 2020-04-26 DIAGNOSIS — I4891 Unspecified atrial fibrillation: Secondary | ICD-10-CM | POA: Diagnosis not present

## 2020-04-26 NOTE — Patient Instructions (Signed)
  Testing/Procedures:  CT OF CHEST WO TO FOLLOW UP ON LUNG NODULE AT [facility] = [place]   Follow-Up: At [facility], you and your health needs are our priority.  As part of our continuing mission to provide you with exceptional heart care, we have created designated Provider Care Teams.  These Care Teams include your primary Cardiologist (physician) and Advanced Practice Providers (APPs -  Physician Assistants and Nurse Practitioners) who all work together to provide you with the care you need, when you need it.  We recommend signing up for the patient portal called "MyChart".  Sign up information is provided on this After Visit Summary.  MyChart is used to connect with patients for Virtual Visits (Telemedicine).  Patients are able to view lab/test results, encounter notes, upcoming appointments, etc.  Non-urgent messages can be sent to your provider as well.   To learn more about what you can do with MyChart, go to [email or web address].    Your next appointment:   12 month(s)  The format for your next appointment:   In Person  Provider:   Kirk Ruths, MD

## 2020-05-09 ENCOUNTER — Other Ambulatory Visit: Payer: Self-pay

## 2020-05-09 ENCOUNTER — Ambulatory Visit (INDEPENDENT_AMBULATORY_CARE_PROVIDER_SITE_OTHER): Payer: Federal, State, Local not specified - PPO

## 2020-05-09 DIAGNOSIS — Z7901 Long term (current) use of anticoagulants: Secondary | ICD-10-CM | POA: Diagnosis not present

## 2020-05-09 LAB — POCT INR: INR: 2.8 (ref 2.0–3.0)

## 2020-05-09 NOTE — Patient Instructions (Signed)
Continue taking 1.5 tablets daily except for 1 tablet every Monday and Friday.  Repeat INR in 6 weeks

## 2020-05-16 ENCOUNTER — Other Ambulatory Visit: Payer: Self-pay

## 2020-05-16 ENCOUNTER — Ambulatory Visit
Admission: RE | Admit: 2020-05-16 | Discharge: 2020-05-16 | Disposition: A | Discharge status: Home / Self Care | Payer: Federal, State, Local not specified - PPO | Source: Ambulatory Visit | Attending: Cardiology | Admitting: Cardiology

## 2020-05-16 DIAGNOSIS — R911 Solitary pulmonary nodule: Secondary | ICD-10-CM

## 2020-05-22 ENCOUNTER — Encounter: Payer: Self-pay | Admitting: *Deleted

## 2020-06-06 ENCOUNTER — Other Ambulatory Visit: Payer: Self-pay | Admitting: Cardiology

## 2020-06-20 ENCOUNTER — Ambulatory Visit (INDEPENDENT_AMBULATORY_CARE_PROVIDER_SITE_OTHER): Payer: Federal, State, Local not specified - PPO

## 2020-06-20 ENCOUNTER — Other Ambulatory Visit: Payer: Self-pay

## 2020-06-20 DIAGNOSIS — Z7901 Long term (current) use of anticoagulants: Secondary | ICD-10-CM | POA: Diagnosis not present

## 2020-06-20 LAB — POCT INR: INR: 2.6 (ref 2.0–3.0)

## 2020-06-20 NOTE — Patient Instructions (Signed)
Continue taking 1.5 tablets daily except for 1 tablet every Monday and Friday.  Repeat INR in 6 weeks

## 2020-08-01 ENCOUNTER — Ambulatory Visit (INDEPENDENT_AMBULATORY_CARE_PROVIDER_SITE_OTHER): Payer: Federal, State, Local not specified - PPO

## 2020-08-01 ENCOUNTER — Other Ambulatory Visit: Payer: Self-pay

## 2020-08-01 DIAGNOSIS — Z7901 Long term (current) use of anticoagulants: Secondary | ICD-10-CM

## 2020-08-01 LAB — POCT INR: INR: 2.4 (ref 2.0–3.0)

## 2020-08-01 NOTE — Patient Instructions (Signed)
Continue taking 1.5 tablets daily except for 1 tablet every Monday and Friday.  Repeat INR in 8 weeks  °

## 2020-09-02 ENCOUNTER — Other Ambulatory Visit: Payer: Self-pay | Admitting: Cardiology

## 2020-09-26 ENCOUNTER — Other Ambulatory Visit: Payer: Self-pay

## 2020-09-26 ENCOUNTER — Ambulatory Visit: Payer: Federal, State, Local not specified - PPO

## 2020-09-26 DIAGNOSIS — Z7901 Long term (current) use of anticoagulants: Secondary | ICD-10-CM | POA: Diagnosis not present

## 2020-09-26 LAB — POCT INR: INR: 2.6 (ref 2.0–3.0)

## 2020-09-26 NOTE — Patient Instructions (Signed)
Continue taking 1.5 tablets daily except for 1 tablet every Monday and Friday.  Repeat INR in 8 weeks  °

## 2020-11-21 ENCOUNTER — Ambulatory Visit (INDEPENDENT_AMBULATORY_CARE_PROVIDER_SITE_OTHER): Payer: Federal, State, Local not specified - PPO

## 2020-11-21 ENCOUNTER — Other Ambulatory Visit: Payer: Self-pay

## 2020-11-21 DIAGNOSIS — Z7901 Long term (current) use of anticoagulants: Secondary | ICD-10-CM | POA: Diagnosis not present

## 2020-11-21 LAB — POCT INR: INR: 2.6 (ref 2.0–3.0)

## 2020-11-21 NOTE — Patient Instructions (Signed)
Continue taking 1.5 tablets daily except for 1 tablet every Monday and Friday.  Repeat INR in 8 weeks  °

## 2020-11-29 ENCOUNTER — Other Ambulatory Visit: Payer: Self-pay | Admitting: Cardiology

## 2020-11-29 DIAGNOSIS — I4821 Permanent atrial fibrillation: Secondary | ICD-10-CM

## 2020-12-01 ENCOUNTER — Other Ambulatory Visit: Payer: Self-pay | Admitting: Cardiology

## 2021-01-16 ENCOUNTER — Other Ambulatory Visit: Payer: Self-pay

## 2021-01-16 ENCOUNTER — Ambulatory Visit: Payer: Federal, State, Local not specified - PPO

## 2021-01-16 DIAGNOSIS — Z7901 Long term (current) use of anticoagulants: Secondary | ICD-10-CM

## 2021-01-16 DIAGNOSIS — I4891 Unspecified atrial fibrillation: Secondary | ICD-10-CM

## 2021-01-16 LAB — POCT INR: INR: 2.5 (ref 2.0–3.0)

## 2021-01-16 NOTE — Patient Instructions (Signed)
Description   Continue taking 1.5 tablets daily except for 1 tablet every Monday and Friday.  Repeat INR in 8 weeks

## 2021-03-13 ENCOUNTER — Other Ambulatory Visit: Payer: Self-pay

## 2021-03-13 ENCOUNTER — Ambulatory Visit (INDEPENDENT_AMBULATORY_CARE_PROVIDER_SITE_OTHER): Payer: Federal, State, Local not specified - PPO

## 2021-03-13 DIAGNOSIS — Z7901 Long term (current) use of anticoagulants: Secondary | ICD-10-CM

## 2021-03-13 LAB — POCT INR: INR: 2.6 (ref 2.0–3.0)

## 2021-03-13 NOTE — Patient Instructions (Signed)
Continue taking 1.5 tablets daily except for 1 tablet every Monday and Friday.  Repeat INR in 8 weeks

## 2021-03-27 ENCOUNTER — Other Ambulatory Visit: Payer: Self-pay | Admitting: Cardiology

## 2021-04-08 ENCOUNTER — Telehealth: Payer: Self-pay

## 2021-04-08 NOTE — Telephone Encounter (Signed)
Lmom to r/s as the coumadin clinic will be closed on 4/19 due to short staff ?

## 2021-04-10 ENCOUNTER — Other Ambulatory Visit: Payer: Self-pay | Admitting: *Deleted

## 2021-04-10 DIAGNOSIS — R918 Other nonspecific abnormal finding of lung field: Secondary | ICD-10-CM

## 2021-04-22 ENCOUNTER — Other Ambulatory Visit: Payer: Federal, State, Local not specified - PPO

## 2021-05-02 ENCOUNTER — Ambulatory Visit
Admission: RE | Admit: 2021-05-02 | Discharge: 2021-05-02 | Disposition: A | Discharge status: Home / Self Care | Payer: Federal, State, Local not specified - PPO | Source: Ambulatory Visit | Attending: Cardiology | Admitting: Cardiology

## 2021-05-02 DIAGNOSIS — R918 Other nonspecific abnormal finding of lung field: Secondary | ICD-10-CM

## 2021-05-08 NOTE — Progress Notes (Signed)
? ? ? ? ?HPI:FU hypertension, hyperlipidemia, and atrial fibrillation. Myoview performed on December 23, 2007 showed an ejection fraction 64% with no ischemia or infarction. He has had a previous attempt at outpatient cardioversion. He transiently converted to sinus rhythm, but maintained sinus rhythm only for seconds. We have therefore elected to treat him with rate control and anticoagulation. Holter monitor September 2019 showed atrial fibrillation with PVCs or aberrantly conducted beats rate controlled.  Echocardiogram September 2019 showed normal LV function, mild left ventricular hypertrophy, biatrial enlargement and moderate pulmonary hypertension. Carotid Dopplers December 2020 showed 1 to 39% bilateral stenosis.  Since I last saw him, he has some dyspnea on exertion.  He denies orthopnea, PND, pedal edema, chest pain or syncope. ? ? ?Current Outpatient Medications  ?Medication Sig Dispense Refill  ? acetaminophen (TYLENOL) 500 MG tablet Take 1,000 mg by mouth 2 (two) times daily.    ? allopurinol (ZYLOPRIM) 300 MG tablet Take 300 mg by mouth daily.    ? atorvastatin (LIPITOR) 40 MG tablet Take 40 mg by mouth daily.    ? ezetimibe (ZETIA) 10 MG tablet Take 10 mg by mouth daily.    ? latanoprost (XALATAN) 0.005 % ophthalmic solution Place 1 drop into both eyes at bedtime.      ? losartan (COZAAR) 100 MG tablet Take 1 tablet (100 mg total) by mouth daily. 90 tablet 4  ? metoprolol succinate (TOPROL-XL) 100 MG 24 hr tablet TAKE ONE AND ONE HALF TABLETS WITH OR IMMEDIATELY FOLLOWING A MEAL. 135 tablet 3  ? Multiple Vitamin (MULTIVITAMIN) tablet Take 1 tablet by mouth daily.      ? torsemide (DEMADEX) 20 MG tablet Take 1.5 tablets (30 mg total) by mouth daily. 30 tablet 12  ? warfarin (COUMADIN) 4 MG tablet TAKE 1 TO 2 TABLETS DAILY AS DIRECTED BY THE COUMADIN CLINIC 135 tablet 0  ? ?No current facility-administered medications for this visit.  ? ? ? ?Past Medical History:  ?Diagnosis Date  ? Aortic insufficiency    ? Atrial fibrillation ([place])   ? CKD (chronic kidney disease)   ? Diverticulosis   ? External hemorrhoids   ? Glaucoma 2010  ? Glucose intolerance (impaired glucose tolerance)   ? Hearing loss of both ears 2012  ? HTN (hypertension)   ? Hyperlipidemia   ? Left lumbar radiculopathy 2013  ? Macular degeneration 2012  ? right  ? Mitral regurgitation   ? Prostate cancer ([place])   ? XRT  ? Tricuspid regurgitation   ? Tubular adenoma of colon 11/2004  ? ? ?Past Surgical History:  ?Procedure Laterality Date  ? APPENDECTOMY    ? CATARACT EXTRACTION  2008  ? COLONOSCOPY  2006  ? [place]  ? VIDEO BRONCHOSCOPY Bilateral 11/05/2017  ? Procedure: VIDEO BRONCHOSCOPY WITHOUT FLUORO;  Surgeon: Tanda Rockers, MD;  Location: [place] [facility];  Service: Cardiopulmonary;  Laterality: Bilateral;  ? ? ?Social History  ? ?Socioeconomic History  ? Marital status: Married  ?  Spouse name: Not on file  ? Number of children: Not on file  ? Years of education: Not on file  ? Highest education level: Not on file  ?Occupational History  ? Occupation: retired  ?Tobacco Use  ? Smoking status: Former  ?  Packs/day: 1.00  ?  Types: Cigarettes, Pipe  ?  Start date: 11/04/1946  ?  Quit date: 02/05/1969  ?  Years since quitting: 52.3  ? Smokeless tobacco: Never  ?Substance and Sexual Activity  ?  Alcohol use: Yes  ?  Alcohol/week: 5.0 standard drinks  ?  Types: 5 Glasses of wine per week  ?  Comment: every afternoon  ? Drug use: No  ? Sexual activity: Not on file  ?Other Topics Concern  ? Not on file  ?Social History Narrative  ? Not on file  ? ?Social Determinants of Health  ? ?Financial Resource Strain: Not on file  ?Food Insecurity: Not on file  ?Transportation Needs: Not on file  ?Physical Activity: Not on file  ?Stress: Not on file  ?Social Connections: Not on file  ?Intimate Partner Violence: Not on file  ? ? ?Family History  ?Problem Relation Age of Onset  ? Kidney failure Mother   ? ? ?ROS: no fevers or chills, productive cough,  hemoptysis, dysphasia, odynophagia, melena, hematochezia, dysuria, hematuria, rash, seizure activity, orthopnea, PND, pedal edema, claudication. Remaining systems are negative. ? ?Physical Exam: ?Well-developed well-nourished in no acute distress.  ?Skin is warm and dry.  ?HEENT is normal.  ?Neck is supple.  ?Chest is clear to auscultation with normal expansion.  ?Cardiovascular exam is irregular ?Abdominal exam nontender or distended. No masses palpated. ?Extremities show no edema. ?neuro grossly intact ? ?ECG-atrial fibrillation at a rate of 89, cannot rule out septal infarct.  Personally reviewed ? ?A/P ? ?1 permanent atrial fibrillation-continue beta-blocker and Coumadin.  We have provided the name of apixaban and Xarelto.  They will check with their insurance company and if affordable they will consider changing and will contact us.  Hemoglobin monitored by primary care. ? ?2 hypertension-patient's blood pressure is controlled.  Continue present medical regimen. ? ?3 hyperlipidemia-continue statin. ? ?4 lower extremity edema-this is reasonably well controlled.  Continue diuretic at present dose.  Renal function monitored by primary care. ? ?5 carotid artery disease-mild on most recent Dopplers. ? ?6 chronic stage III kidney disease-followed by nephrology. ? ?Kirk Ruths, MD ? ? ? ?

## 2021-05-09 ENCOUNTER — Ambulatory Visit: Payer: Federal, State, Local not specified - PPO

## 2021-05-09 DIAGNOSIS — Z7901 Long term (current) use of anticoagulants: Secondary | ICD-10-CM | POA: Diagnosis not present

## 2021-05-09 LAB — POCT INR: INR: 2.6 (ref 2.0–3.0)

## 2021-05-09 NOTE — Patient Instructions (Signed)
Continue taking 1.5 tablets daily except for 1 tablet every Monday and Friday.  Repeat INR in 8 weeks  ?

## 2021-05-14 ENCOUNTER — Ambulatory Visit (INDEPENDENT_AMBULATORY_CARE_PROVIDER_SITE_OTHER): Payer: Federal, State, Local not specified - PPO | Admitting: Cardiology

## 2021-05-14 ENCOUNTER — Encounter: Payer: Self-pay | Admitting: Cardiology

## 2021-05-14 VITALS — BP 140/90 | HR 89 | Ht 69.0 in | Wt 221.6 lb

## 2021-05-14 DIAGNOSIS — E7800 Pure hypercholesterolemia, unspecified: Secondary | ICD-10-CM | POA: Diagnosis not present

## 2021-05-14 DIAGNOSIS — I10 Essential (primary) hypertension: Secondary | ICD-10-CM

## 2021-05-14 DIAGNOSIS — I4891 Unspecified atrial fibrillation: Secondary | ICD-10-CM

## 2021-05-14 NOTE — Patient Instructions (Addendum)
ELIQUIS AND XARELTO FOR BLOOD THINNER ? ? ?Testing/Procedures: ? ?Your physician has requested that you have an echocardiogram. Echocardiography is a painless test that uses sound waves to create images of your heart. It provides your doctor with information about the size and shape of your heart and how well your heart?s chambers and valves are working. This procedure takes approximately one hour. There are no restrictions for this procedure. [place]: ?At [facility], you and your health needs are our priority.  As part of our continuing mission to provide you with exceptional heart care, we have created designated Provider Care Teams.  These Care Teams include your primary Cardiologist (physician) and Advanced Practice Providers (APPs -  Physician Assistants and Nurse Practitioners) who all work together to provide you with the care you need, when you need it. ? ?We recommend signing up for the patient portal called "MyChart".  Sign up information is provided on this After Visit Summary.  MyChart is used to connect with patients for Virtual Visits (Telemedicine).  Patients are able to view lab/test results, encounter notes, upcoming appointments, etc.  Non-urgent messages can be sent to your provider as well.   ?To learn more about what you can do with MyChart, go to [email or web address].   ? ?Your next appointment:   ?12 month(s) ? ?The format for your next appointment:   ?In Person ? ?Provider:   ?Kirk Ruths, MD   ? ? ? ? ?Important Information About Sugar ? ? ? ? ?  ?

## 2021-05-27 ENCOUNTER — Ambulatory Visit (HOSPITAL_COMMUNITY): Payer: Federal, State, Local not specified - PPO | Attending: Cardiology

## 2021-05-27 DIAGNOSIS — I4891 Unspecified atrial fibrillation: Secondary | ICD-10-CM | POA: Diagnosis present

## 2021-05-27 LAB — ECHOCARDIOGRAM COMPLETE: S' Lateral: 3 cm

## 2021-05-29 ENCOUNTER — Encounter: Payer: Self-pay | Admitting: *Deleted

## 2021-06-27 ENCOUNTER — Other Ambulatory Visit: Payer: Self-pay | Admitting: Cardiology

## 2021-07-04 ENCOUNTER — Ambulatory Visit (INDEPENDENT_AMBULATORY_CARE_PROVIDER_SITE_OTHER): Payer: Federal, State, Local not specified - PPO

## 2021-07-04 DIAGNOSIS — Z7901 Long term (current) use of anticoagulants: Secondary | ICD-10-CM | POA: Diagnosis not present

## 2021-07-04 DIAGNOSIS — I4891 Unspecified atrial fibrillation: Secondary | ICD-10-CM

## 2021-07-04 LAB — POCT INR: INR: 3.1 — AB (ref 2.0–3.0)

## 2021-07-04 NOTE — Patient Instructions (Signed)
Description   Take 1 tablet today, then resume same dosage 1.5 tablets daily except for 1 tablet every Mondays and Fridays.  Repeat INR in 8 weeks

## 2021-08-28 ENCOUNTER — Ambulatory Visit (INDEPENDENT_AMBULATORY_CARE_PROVIDER_SITE_OTHER): Payer: Federal, State, Local not specified - PPO | Admitting: *Deleted

## 2021-08-28 DIAGNOSIS — Z7901 Long term (current) use of anticoagulants: Secondary | ICD-10-CM

## 2021-08-28 DIAGNOSIS — I4891 Unspecified atrial fibrillation: Secondary | ICD-10-CM | POA: Diagnosis not present

## 2021-08-28 LAB — POCT INR: INR: 2.7 (ref 2.0–3.0)

## 2021-08-28 NOTE — Patient Instructions (Signed)
Description   Continue taking 1.5 tablets daily except for 1 tablet every Mondays and Fridays.  Repeat INR in 8 weeks. Coumadin Clinic [phone]

## 2021-09-21 ENCOUNTER — Other Ambulatory Visit: Payer: Self-pay | Admitting: Cardiology

## 2021-10-23 ENCOUNTER — Ambulatory Visit: Payer: Federal, State, Local not specified - PPO | Attending: Cardiology | Admitting: *Deleted

## 2021-10-23 DIAGNOSIS — I4891 Unspecified atrial fibrillation: Secondary | ICD-10-CM

## 2021-10-23 DIAGNOSIS — Z7901 Long term (current) use of anticoagulants: Secondary | ICD-10-CM | POA: Diagnosis not present

## 2021-10-23 LAB — POCT INR: INR: 2.6 (ref 2.0–3.0)

## 2021-10-23 NOTE — Patient Instructions (Signed)
Description   Continue taking 1.5 tablets daily except for 1 tablet every Mondays and Fridays.  Repeat INR in 8 weeks. Coumadin Clinic [phone]

## 2021-12-02 ENCOUNTER — Other Ambulatory Visit: Payer: Self-pay | Admitting: Cardiology

## 2021-12-02 DIAGNOSIS — I4821 Permanent atrial fibrillation: Secondary | ICD-10-CM

## 2021-12-17 ENCOUNTER — Other Ambulatory Visit: Payer: Self-pay | Admitting: Cardiology

## 2021-12-18 ENCOUNTER — Ambulatory Visit: Payer: Federal, State, Local not specified - PPO

## 2021-12-26 ENCOUNTER — Ambulatory Visit: Payer: Federal, State, Local not specified - PPO | Attending: Cardiology | Admitting: *Deleted

## 2021-12-26 DIAGNOSIS — I4891 Unspecified atrial fibrillation: Secondary | ICD-10-CM | POA: Diagnosis not present

## 2021-12-26 DIAGNOSIS — Z7901 Long term (current) use of anticoagulants: Secondary | ICD-10-CM

## 2021-12-26 LAB — POCT INR: INR: 3.2 — AB (ref 2.0–3.0)

## 2021-12-26 NOTE — Patient Instructions (Signed)
Description   Today take 1 tablet of warfarin then continue taking 1.5 tablets daily except for 1 tablet every Mondays and Fridays.  Repeat INR in 6 weeks. Coumadin Clinic [phone]

## 2022-02-06 ENCOUNTER — Ambulatory Visit: Payer: Federal, State, Local not specified - PPO | Attending: Cardiology | Admitting: *Deleted

## 2022-02-06 DIAGNOSIS — I4891 Unspecified atrial fibrillation: Secondary | ICD-10-CM | POA: Diagnosis not present

## 2022-02-06 DIAGNOSIS — Z7901 Long term (current) use of anticoagulants: Secondary | ICD-10-CM

## 2022-02-06 LAB — POCT INR: INR: 3.2 — AB (ref 2.0–3.0)

## 2022-02-06 NOTE — Patient Instructions (Addendum)
Description   Today take 1 tablet of warfarin then START taking 1.5 tablets daily except for 1 tablet every Mondays, Wednesdays, and Fridays.  Repeat INR in 4 weeks (normally 6 weeks). Coumadin Clinic [phone]

## 2022-03-05 ENCOUNTER — Ambulatory Visit: Payer: Federal, State, Local not specified - PPO | Attending: Cardiology | Admitting: *Deleted

## 2022-03-05 DIAGNOSIS — Z7901 Long term (current) use of anticoagulants: Secondary | ICD-10-CM

## 2022-03-05 DIAGNOSIS — I4891 Unspecified atrial fibrillation: Secondary | ICD-10-CM | POA: Diagnosis not present

## 2022-03-05 LAB — POCT INR: INR: 2.6 (ref 2.0–3.0)

## 2022-03-05 NOTE — Patient Instructions (Signed)
Description   Continue taking warfarin 1.5 tablets daily except for 1 tablet every Mondays, Wednesdays, and Fridays. Repeat INR in 6 weeks. Coumadin Clinic [phone]

## 2022-03-11 ENCOUNTER — Other Ambulatory Visit: Payer: Self-pay | Admitting: Cardiology

## 2022-03-11 DIAGNOSIS — I4891 Unspecified atrial fibrillation: Secondary | ICD-10-CM

## 2022-03-11 NOTE — Telephone Encounter (Signed)
Warfarin 58m Afib Last INR 03/05/22 Last OV 05/14/21

## 2022-03-24 ENCOUNTER — Encounter: Payer: Self-pay | Admitting: Physician Assistant

## 2022-03-25 ENCOUNTER — Other Ambulatory Visit: Payer: Self-pay | Admitting: Family Medicine

## 2022-03-25 DIAGNOSIS — N179 Acute kidney failure, unspecified: Secondary | ICD-10-CM

## 2022-03-25 DIAGNOSIS — R1909 Other intra-abdominal and pelvic swelling, mass and lump: Secondary | ICD-10-CM

## 2022-04-10 ENCOUNTER — Ambulatory Visit
Admission: RE | Admit: 2022-04-10 | Discharge: 2022-04-10 | Disposition: A | Discharge status: Home / Self Care | Payer: Federal, State, Local not specified - PPO | Source: Ambulatory Visit | Attending: Family Medicine | Admitting: Family Medicine

## 2022-04-10 DIAGNOSIS — R1909 Other intra-abdominal and pelvic swelling, mass and lump: Secondary | ICD-10-CM

## 2022-04-10 DIAGNOSIS — N179 Acute kidney failure, unspecified: Secondary | ICD-10-CM

## 2022-04-16 ENCOUNTER — Ambulatory Visit: Payer: Federal, State, Local not specified - PPO | Attending: Cardiology

## 2022-04-16 DIAGNOSIS — Z7901 Long term (current) use of anticoagulants: Secondary | ICD-10-CM | POA: Diagnosis not present

## 2022-04-16 LAB — POCT INR: INR: 2.9 (ref 2.0–3.0)

## 2022-04-16 NOTE — Patient Instructions (Signed)
Description   Continue taking warfarin 1.5 tablets daily except for 1 tablet every Mondays, Wednesdays, and Fridays. Repeat INR in 7 weeks.  Coumadin Clinic [phone]

## 2022-04-21 ENCOUNTER — Other Ambulatory Visit: Payer: Federal, State, Local not specified - PPO

## 2022-06-04 ENCOUNTER — Ambulatory Visit: Payer: Federal, State, Local not specified - PPO | Attending: Cardiology

## 2022-06-04 DIAGNOSIS — Z7901 Long term (current) use of anticoagulants: Secondary | ICD-10-CM

## 2022-06-04 LAB — POCT INR: INR: 2.7 (ref 2.0–3.0)

## 2022-06-04 NOTE — Patient Instructions (Signed)
Description   Continue taking warfarin 1.5 tablets daily except for 1 tablet every Mondays, Wednesdays, and Fridays. Repeat INR in 7 weeks.  Coumadin Clinic [phone]      

## 2022-06-07 ENCOUNTER — Other Ambulatory Visit: Payer: Self-pay | Admitting: Cardiology

## 2022-06-07 DIAGNOSIS — I4891 Unspecified atrial fibrillation: Secondary | ICD-10-CM

## 2022-07-23 ENCOUNTER — Ambulatory Visit: Payer: Federal, State, Local not specified - PPO | Attending: Cardiovascular Disease | Admitting: *Deleted

## 2022-07-23 DIAGNOSIS — Z7901 Long term (current) use of anticoagulants: Secondary | ICD-10-CM

## 2022-07-23 DIAGNOSIS — I4891 Unspecified atrial fibrillation: Secondary | ICD-10-CM | POA: Diagnosis not present

## 2022-07-23 LAB — POCT INR: INR: 2.8 (ref 2.0–3.0)

## 2022-07-23 NOTE — Patient Instructions (Signed)
Description   Continue taking warfarin 1.5 tablets daily except for 1 tablet every Mondays, Wednesdays, and Fridays. Repeat INR in 7 weeks.  Coumadin Clinic [phone]      

## 2022-09-10 ENCOUNTER — Ambulatory Visit: Payer: Federal, State, Local not specified - PPO | Attending: Cardiology

## 2022-09-10 DIAGNOSIS — Z7901 Long term (current) use of anticoagulants: Secondary | ICD-10-CM | POA: Diagnosis not present

## 2022-09-10 LAB — POCT INR: INR: 3.1 — AB (ref 2.0–3.0)

## 2022-09-10 NOTE — Patient Instructions (Signed)
Description   Eat a serving of greens today and continue taking warfarin 1.5 tablets daily except for 1 tablet every Mondays, Wednesdays, and Fridays. Repeat INR in 7 weeks.  Coumadin Clinic [phone]

## 2022-10-29 ENCOUNTER — Ambulatory Visit: Payer: Federal, State, Local not specified - PPO | Attending: Cardiology | Admitting: *Deleted

## 2022-10-29 DIAGNOSIS — I4891 Unspecified atrial fibrillation: Secondary | ICD-10-CM

## 2022-10-29 DIAGNOSIS — Z7901 Long term (current) use of anticoagulants: Secondary | ICD-10-CM

## 2022-10-29 LAB — POCT INR: INR: 3.1 — AB (ref 2.0–3.0)

## 2022-10-29 NOTE — Patient Instructions (Signed)
Description   Eat a serving of greens today and then START taking warfarin 1 tablet daily except for 1.5 tablets every Sundays, Tuesdays, and Thursdays. Repeat INR in 4 weeks.  Coumadin Clinic [phone]

## 2022-11-26 ENCOUNTER — Other Ambulatory Visit: Payer: Self-pay | Admitting: Cardiology

## 2022-11-26 ENCOUNTER — Ambulatory Visit: Payer: Federal, State, Local not specified - PPO | Attending: Cardiology | Admitting: *Deleted

## 2022-11-26 DIAGNOSIS — I4891 Unspecified atrial fibrillation: Secondary | ICD-10-CM | POA: Diagnosis not present

## 2022-11-26 DIAGNOSIS — Z7901 Long term (current) use of anticoagulants: Secondary | ICD-10-CM | POA: Diagnosis not present

## 2022-11-26 DIAGNOSIS — I4821 Permanent atrial fibrillation: Secondary | ICD-10-CM

## 2022-11-26 LAB — POCT INR: INR: 2.7 (ref 2.0–3.0)

## 2022-11-26 NOTE — Telephone Encounter (Signed)
Refill request for warfarin:  Last INR was 2.7 on 11/26/22 Next INR due 12/31/22 LOV was 05/14/21  Refill approved. Pt needs appt with Dr Jens Som.  She is past due.  Message sent to schedulers.

## 2022-11-26 NOTE — Patient Instructions (Signed)
Description   Continue taking warfarin 1 tablet daily except for 1.5 tablets every Sundays, Tuesdays, and Thursdays. Repeat INR in 5 weeks (normally 8 weeks).  Coumadin Clinic [phone]

## 2022-12-31 ENCOUNTER — Ambulatory Visit: Payer: Federal, State, Local not specified - PPO | Attending: Cardiology | Admitting: *Deleted

## 2022-12-31 DIAGNOSIS — Z7901 Long term (current) use of anticoagulants: Secondary | ICD-10-CM

## 2022-12-31 DIAGNOSIS — I4891 Unspecified atrial fibrillation: Secondary | ICD-10-CM | POA: Diagnosis not present

## 2022-12-31 LAB — POCT INR: INR: 2.6 (ref 2.0–3.0)

## 2022-12-31 NOTE — Patient Instructions (Signed)
Description   Continue taking warfarin 1 tablet daily except for 1.5 tablets every Sundays, Tuesdays, and Thursdays. Repeat INR in 8 weeks.  Coumadin Clinic [phone]

## 2023-02-25 ENCOUNTER — Ambulatory Visit: Payer: Federal, State, Local not specified - PPO | Attending: Cardiology

## 2023-02-25 DIAGNOSIS — Z7901 Long term (current) use of anticoagulants: Secondary | ICD-10-CM | POA: Diagnosis not present

## 2023-02-25 LAB — POCT INR: INR: 2.6 (ref 2.0–3.0)

## 2023-02-25 NOTE — Patient Instructions (Signed)
 Description   Continue taking warfarin 1 tablet daily except for 1.5 tablets every Sundays, Tuesdays, and Thursdays. Repeat INR in 8 weeks.  Coumadin Clinic [phone]

## 2023-03-30 ENCOUNTER — Other Ambulatory Visit: Payer: Self-pay | Admitting: Cardiology

## 2023-03-30 DIAGNOSIS — I4891 Unspecified atrial fibrillation: Secondary | ICD-10-CM

## 2023-04-22 ENCOUNTER — Ambulatory Visit: Payer: Federal, State, Local not specified - PPO | Attending: Cardiology | Admitting: *Deleted

## 2023-04-22 DIAGNOSIS — I4891 Unspecified atrial fibrillation: Secondary | ICD-10-CM

## 2023-04-22 DIAGNOSIS — Z7901 Long term (current) use of anticoagulants: Secondary | ICD-10-CM

## 2023-04-22 LAB — POCT INR: INR: 2.3 (ref 2.0–3.0)

## 2023-04-22 NOTE — Patient Instructions (Signed)
 Description   Continue taking warfarin 1 tablet daily except for 1.5 tablets every Sundays, Tuesdays, and Thursdays. Repeat INR in 8 weeks.  Coumadin Clinic [phone]

## 2023-06-17 ENCOUNTER — Ambulatory Visit: Attending: Cardiology | Admitting: Pharmacist

## 2023-06-17 DIAGNOSIS — I4891 Unspecified atrial fibrillation: Secondary | ICD-10-CM | POA: Diagnosis not present

## 2023-06-17 DIAGNOSIS — Z7901 Long term (current) use of anticoagulants: Secondary | ICD-10-CM

## 2023-06-17 LAB — POCT INR: INR: 2.3 (ref 2.0–3.0)

## 2023-06-17 NOTE — Patient Instructions (Signed)
 Description   Continue taking warfarin 1 tablet daily except for 1.5 tablets every Sundays, Tuesdays, and Thursdays. Repeat INR in 8 weeks.  Coumadin Clinic [phone]

## 2023-07-16 ENCOUNTER — Other Ambulatory Visit: Payer: Self-pay | Admitting: Cardiology

## 2023-07-16 DIAGNOSIS — I4891 Unspecified atrial fibrillation: Secondary | ICD-10-CM

## 2023-07-16 NOTE — Telephone Encounter (Signed)
 Prescription refill request received for warfarin Lov: 05/14/21 Ronnie Sullivan)  Next INR check: 08/12/23 Warfarin tablet strength: 4mg   Office visit overdue. Note placed on upcoming coumadin  clinic appt for pt to schedule a cardiology appt. Refill sent.

## 2023-08-12 ENCOUNTER — Ambulatory Visit: Attending: Cardiology

## 2023-08-12 DIAGNOSIS — Z7901 Long term (current) use of anticoagulants: Secondary | ICD-10-CM

## 2023-08-12 DIAGNOSIS — I4891 Unspecified atrial fibrillation: Secondary | ICD-10-CM | POA: Diagnosis not present

## 2023-08-12 LAB — POCT INR: INR: 2.2 (ref 2.0–3.0)

## 2023-08-12 NOTE — Patient Instructions (Signed)
 Description   Continue taking warfarin 1 tablet daily except for 1.5 tablets every Sundays, Tuesdays, and Thursdays. Repeat INR in 8 weeks.  Coumadin Clinic [phone]

## 2023-08-12 NOTE — Progress Notes (Signed)
 INR 2.2, Please see anticoagulation encounter

## 2023-09-25 NOTE — Progress Notes (Signed)
 HPI: FU hypertension, hyperlipidemia, and atrial fibrillation. Myoview performed on December 23, 2007 showed an ejection fraction 64% with no ischemia or infarction. He has had a previous attempt at outpatient cardioversion. He transiently converted to sinus rhythm, but maintained sinus rhythm only for seconds. We have therefore elected to treat him with rate control and anticoagulation. Holter monitor September 2019 showed atrial fibrillation with PVCs or aberrantly conducted beats rate controlled.  Carotid Dopplers December 2020 showed 1 to 39% bilateral stenosis.  Coronary CTA April 2023 showed coronary calcification.  Echocardiogram May 2023 showed normal LV function, mild left ventricular hypertrophy, severe left atrial enlargement, moderate right atrial enlargement, mild mitral regurgitation, trace aortic insufficiency.  Since I last saw him, he has dyspnea with more vigorous activities but not routine things in the house.  He has chronic pedal edema.  He denies orthopnea, PND, palpitations, bleeding, falling or chest pain.  Current Outpatient Medications  Medication Sig Dispense Refill   acetaminophen (TYLENOL) 500 MG tablet Take 1,000 mg by mouth 2 (two) times daily.     allopurinol (ZYLOPRIM) 100 MG tablet Take 100 mg by mouth daily.     atorvastatin (LIPITOR) 40 MG tablet Take 40 mg by mouth daily.     ezetimibe (ZETIA) 10 MG tablet Take 10 mg by mouth daily.     latanoprost (XALATAN) 0.005 % ophthalmic solution Place 1 drop into both eyes at bedtime.       losartan  (COZAAR ) 100 MG tablet Take 1 tablet (100 mg total) by mouth daily. 90 tablet 4   metoprolol  succinate (TOPROL -XL) 100 MG 24 hr tablet TAKE 1 AND 1/2 TABLETS WITH OR IMMEDIATELY FOLLOWING A MEAL 135 tablet 3   Multiple Vitamin (MULTIVITAMIN) tablet Take 1 tablet by mouth daily.       torsemide  (DEMADEX ) 20 MG tablet Take 20 mg by mouth daily.     warfarin (COUMADIN ) 4 MG tablet TAKE 1 TO 1 AND 1/2 TABLETS DAILY AS DIRECTED  BY THE COUMADIN  CLINIC 120 tablet 0   No current facility-administered medications for this visit.     Past Medical History:  Diagnosis Date   Aortic insufficiency    Atrial fibrillation (HCC)    CKD (chronic kidney disease)    Diverticulosis    External hemorrhoids    Glaucoma 2010   Glucose intolerance (impaired glucose tolerance)    Hearing loss of both ears 2012   HTN (hypertension)    Hyperlipidemia    Left lumbar radiculopathy 2013   Macular degeneration 2012   right   Mitral regurgitation    Prostate cancer (HCC)    XRT   Tricuspid regurgitation    Tubular adenoma of colon 11/2004    Past Surgical History:  Procedure Laterality Date   APPENDECTOMY     CATARACT EXTRACTION  2008   COLONOSCOPY  2006   ELBOW FRACTURE SURGERY  1995   VIDEO BRONCHOSCOPY Bilateral 11/05/2017   Procedure: VIDEO BRONCHOSCOPY WITHOUT FLUORO;  Surgeon: Darlean Ozell NOVAK, MD;  Location: [facility] ENDOSCOPY;  Service: Cardiopulmonary;  Laterality: Bilateral;    Social History   Socioeconomic History   Marital status: Married    Spouse name: Not on file   Number of children: Not on file   Years of education: Not on file   Highest education level: Not on file  Occupational History   Occupation: retired  Tobacco Use   Smoking status: Former    Current packs/day: 0.00    Average packs/day: 1 pack/day for  22.3 years (22.3 ttl pk-yrs)    Types: Cigarettes, Pipe    Start date: 11/04/1946    Quit date: 02/05/1969    Years since quitting: 54.6   Smokeless tobacco: Never  Substance and Sexual Activity   Alcohol use: Yes    Alcohol/week: 5.0 standard drinks of alcohol    Types: 5 Glasses of wine per week    Comment: every afternoon   Drug use: No   Sexual activity: Not on file  Other Topics Concern   Not on file  Social History Narrative   Not on file   Social Drivers of Health   Financial Resource Strain: Not on file  Food Insecurity: Not on file  Transportation Needs: Not on file   Physical Activity: Not on file  Stress: Not on file  Social Connections: Not on file  Intimate Partner Violence: Not on file    Family History  Problem Relation Age of Onset   Kidney failure Mother     ROS: no fevers or chills, productive cough, hemoptysis, dysphasia, odynophagia, melena, hematochezia, dysuria, hematuria, rash, seizure activity, orthopnea, PND, pedal edema, claudication. Remaining systems are negative.  Physical Exam: Well-developed well-nourished in no acute distress.  Skin is warm and dry.  HEENT is normal.  Neck is supple.  Chest is clear to auscultation with normal expansion.  Cardiovascular exam is irregular Abdominal exam nontender or distended. No masses palpated. Extremities show trace edema. neuro grossly intact       A/P  1 permanent atrial fibrillation-continue beta-blocker at present dose for rate control.  Continue Coumadin  with goal INR 2-3.  Hemoglobin monitored by primary care.  We again discussed DOAC's today.  We have provided the name of apixaban.  He will check with his pharmacy and if affordable we will make the transition.  2 hypertension-blood pressure elevated; however he states controlled at home.  I have asked him to follow this and we will adjust medications based on readings at home.  3 hyperlipidemia-continue statin.  Lipids and liver monitored by primary care.  4 carotid artery disease-mild on most recent Dopplers.  5 lower extremity edema-continue diuretic at present dose.  6 chronic kidney disease-followed by nephrology.  Redell Shallow, MD

## 2023-10-02 ENCOUNTER — Encounter: Payer: Self-pay | Admitting: Cardiology

## 2023-10-02 ENCOUNTER — Ambulatory Visit: Attending: Cardiology | Admitting: Cardiology

## 2023-10-02 ENCOUNTER — Ambulatory Visit: Attending: Cardiology | Admitting: *Deleted

## 2023-10-02 VITALS — BP 150/100 | HR 79 | Ht 69.0 in | Wt 203.0 lb

## 2023-10-02 DIAGNOSIS — E7800 Pure hypercholesterolemia, unspecified: Secondary | ICD-10-CM

## 2023-10-02 DIAGNOSIS — I10 Essential (primary) hypertension: Secondary | ICD-10-CM | POA: Diagnosis not present

## 2023-10-02 DIAGNOSIS — R600 Localized edema: Secondary | ICD-10-CM | POA: Diagnosis not present

## 2023-10-02 DIAGNOSIS — I4891 Unspecified atrial fibrillation: Secondary | ICD-10-CM | POA: Diagnosis not present

## 2023-10-02 DIAGNOSIS — Z7901 Long term (current) use of anticoagulants: Secondary | ICD-10-CM

## 2023-10-02 LAB — POCT INR: INR: 2.1 (ref 2.0–3.0)

## 2023-10-02 NOTE — Patient Instructions (Signed)
 Medication Instructions:   ELIQUIS COULD REPLACE WARFARIN  *If you need a refill on your cardiac medications before your next appointment, please call your pharmacy*   Follow-Up: At [facility], you and your health needs are our priority.  As part of our continuing mission to provide you with exceptional heart care, our providers are all part of one team.  This team includes your primary Cardiologist (physician) and Advanced Practice Providers or APPs (Physician Assistants and Nurse Practitioners) who all work together to provide you with the care you need, when you need it.  Your next appointment:   12 month(s)  Provider:   Redell Shallow, MD

## 2023-10-02 NOTE — Progress Notes (Signed)
 Description   INR-2.1; Continue taking warfarin 1 tablet daily except for 1.5 tablets every Sundays, Tuesdays, and Thursdays. Repeat INR in 8 weeks.  Coumadin  Clinic [phone]

## 2023-10-02 NOTE — Patient Instructions (Addendum)
 Description   INR-2.1; Continue taking warfarin 1 tablet daily except for 1.5 tablets every Sundays, Tuesdays, and Thursdays. Repeat INR in 8 weeks.  Coumadin  Clinic [phone]

## 2023-10-07 ENCOUNTER — Other Ambulatory Visit: Payer: Self-pay | Admitting: Cardiology

## 2023-10-07 DIAGNOSIS — I4891 Unspecified atrial fibrillation: Secondary | ICD-10-CM

## 2023-10-12 ENCOUNTER — Other Ambulatory Visit (HOSPITAL_COMMUNITY): Payer: Self-pay | Admitting: Family Medicine

## 2023-10-12 DIAGNOSIS — R7989 Other specified abnormal findings of blood chemistry: Secondary | ICD-10-CM

## 2023-10-12 DIAGNOSIS — E215 Disorder of parathyroid gland, unspecified: Secondary | ICD-10-CM

## 2023-11-23 ENCOUNTER — Other Ambulatory Visit: Payer: Self-pay | Admitting: Cardiology

## 2023-11-23 DIAGNOSIS — I4821 Permanent atrial fibrillation: Secondary | ICD-10-CM

## 2023-11-24 ENCOUNTER — Encounter

## 2023-11-25 ENCOUNTER — Other Ambulatory Visit: Payer: Self-pay | Admitting: *Deleted

## 2023-11-25 ENCOUNTER — Telehealth: Payer: Self-pay | Admitting: Cardiology

## 2023-11-25 ENCOUNTER — Encounter

## 2023-11-25 ENCOUNTER — Ambulatory Visit: Attending: Cardiology | Admitting: *Deleted

## 2023-11-25 DIAGNOSIS — I4891 Unspecified atrial fibrillation: Secondary | ICD-10-CM

## 2023-11-25 DIAGNOSIS — Z7901 Long term (current) use of anticoagulants: Secondary | ICD-10-CM | POA: Diagnosis not present

## 2023-11-25 LAB — POCT INR: INR: 3.1 — AB (ref 2.0–3.0)

## 2023-11-25 MED ORDER — WARFARIN SODIUM 4 MG PO TABS: ORAL_TABLET | ORAL | 1 refills | Status: AC

## 2023-11-25 NOTE — Patient Instructions (Signed)
 Description   INR-3.1; Today take 1/2 tablet of warfarin then continue taking warfarin 1 tablet daily except for 1.5 tablets every Sundays, Tuesdays, and Thursdays. Repeat INR in 8 weeks.  Coumadin  Clinic [phone]

## 2023-11-25 NOTE — Progress Notes (Signed)
 Description   INR-3.1; Today take 1/2 tablet of warfarin then continue taking warfarin 1 tablet daily except for 1.5 tablets every Sundays, Tuesdays, and Thursdays. Repeat INR in 8 weeks.  Coumadin  Clinic [phone]

## 2023-11-25 NOTE — Telephone Encounter (Signed)
 Not needed, per pt requested wrong medication

## 2023-11-25 NOTE — Telephone Encounter (Signed)
 Warfarin 4mg  refill Afib Last INR today on 11/25/23 Last OV 10/02/23

## 2023-11-25 NOTE — Telephone Encounter (Signed)
*  STAT* If patient is at the pharmacy, call can be transferred to refill team.   1. Which medications need to be refilled? (please list name of each medication and dose if known)   warfarin (COUMADIN ) 4 MG tablet   2. Would you like to learn more about the convenience, safety, & potential cost savings by using the [facility]?   3. Are you open to using the [facility] (Type [facility]. ).  4. Which pharmacy/location (including street and city if local pharmacy) is medication to be sent to?  [id number] - [place], [place] - [address]   5. Do they need a 30 day or 90 day supply?   90 day  Wife (Dorothy) stated patient only has a couple of tablets left.

## 2024-01-20 ENCOUNTER — Ambulatory Visit: Admitting: *Deleted

## 2024-01-20 DIAGNOSIS — I4891 Unspecified atrial fibrillation: Secondary | ICD-10-CM

## 2024-01-20 DIAGNOSIS — Z7901 Long term (current) use of anticoagulants: Secondary | ICD-10-CM | POA: Diagnosis not present

## 2024-01-20 LAB — POCT INR: INR: 2.7 (ref 2.0–3.0)

## 2024-01-20 NOTE — Patient Instructions (Signed)
 Description   INR-2.7; Continue taking warfarin 1 tablet daily except for 1.5 tablets every Sundays, Tuesdays, and Thursdays. Repeat INR in 8 weeks.  Coumadin  Clinic [phone]

## 2024-01-20 NOTE — Progress Notes (Signed)
 Description   INR-2.7; Continue taking warfarin 1 tablet daily except for 1.5 tablets every Sundays, Tuesdays, and Thursdays. Repeat INR in 8 weeks.  Coumadin  Clinic [phone]

## 2024-03-16 ENCOUNTER — Ambulatory Visit
